# Patient Record
Sex: Male | Born: 1960 | Race: Black or African American | Hispanic: No | Marital: Married | Smoking: Never smoker
Health system: Southern US, Community
[De-identification: ages and names within clinical notes are randomized; demographics above are authoritative.]

---

## 2011-03-31 ENCOUNTER — Emergency Department (HOSPITAL_BASED_OUTPATIENT_CLINIC_OR_DEPARTMENT_OTHER)
Admission: EM | Admit: 2011-03-31 | Discharge: 2011-03-31 | Disposition: A | Discharge status: Home / Self Care | Payer: Self-pay | Attending: Emergency Medicine | Admitting: Emergency Medicine

## 2011-03-31 ENCOUNTER — Encounter: Payer: Self-pay | Admitting: *Deleted

## 2011-03-31 ENCOUNTER — Emergency Department (INDEPENDENT_AMBULATORY_CARE_PROVIDER_SITE_OTHER): Payer: Self-pay

## 2011-03-31 DIAGNOSIS — M25561 Pain in right knee: Secondary | ICD-10-CM

## 2011-03-31 DIAGNOSIS — M25469 Effusion, unspecified knee: Secondary | ICD-10-CM | POA: Insufficient documentation

## 2011-03-31 DIAGNOSIS — M898X9 Other specified disorders of bone, unspecified site: Secondary | ICD-10-CM

## 2011-03-31 DIAGNOSIS — M25569 Pain in unspecified knee: Secondary | ICD-10-CM | POA: Insufficient documentation

## 2011-03-31 MED ORDER — NAPROXEN 500 MG PO TABS: 500.0000 mg | ORAL_TABLET | Freq: Two times a day (BID) | ORAL | Status: AC

## 2011-03-31 MED ORDER — IBUPROFEN 800 MG PO TABS
800.0000 mg | ORAL_TABLET | Freq: Once | ORAL | Status: AC
  Administered 2011-03-31: 800 mg via ORAL
  Filled 2011-03-31: qty 1

## 2011-03-31 NOTE — ED Provider Notes (Signed)
History     CSN: [id number] Arrival date & time: 03/31/2011  8:48 AM  Chief Complaint  Patient presents with  . Knee Pain   Patient is a 50 y.o. male presenting with knee pain. The history is provided by the patient.  Knee Pain This is a new problem. Episode onset: Pain started three days ago, and is presnt only when he tries to stand on teh right leg. Pain is dull, rated at 0/10 currently, but 10/10 when he stands. The problem has not changed since onset.He has tried acetaminophen for the symptoms. The treatment provided mild relief.  He denies trauma or overuse. He has had knee problems in the past, but cannot tell me any specifics.  History reviewed. No pertinent past medical history.  History reviewed. No pertinent past surgical history.  History reviewed. No pertinent family history.  History  Substance Use Topics  . Smoking status: Never Smoker   . Smokeless tobacco: Not on file  . Alcohol Use: No      Review of Systems  All other systems reviewed and are negative.    Physical Exam  BP 123/79  Pulse 75  Temp(Src) 97.9 F (36.6 C) (Oral)  Resp 18  SpO2 100%  Physical Exam  Nursing note and vitals reviewed. Constitutional: He is oriented to person, place, and time. He appears well-developed and well-nourished. No distress.  Eyes: Conjunctivae and EOM are normal. Pupils are equal, round, and reactive to light. No scleral icterus.  Neck: Normal range of motion. Neck supple. No JVD present.  Cardiovascular: Normal rate, regular rhythm and normal heart sounds.   No murmur heard. Pulmonary/Chest: Effort normal and breath sounds normal. He has no wheezes. He has no rales.  Abdominal: Soft. Bowel sounds are normal. He exhibits no mass. There is no tenderness.  Musculoskeletal: Normal range of motion.       There is mild swelling of the right suprapatellar area, with mild tenderness. No instability of the knee, negative Lachman and McMurray tests. No pain with active or  passive ROM , or with active ROM against resistance. Neurovascular is normal.  Lymphadenopathy:    He has no cervical adenopathy.  Neurological: He is alert and oriented to person, place, and time. He has normal reflexes. No cranial nerve deficit. Coordination normal.  Skin: Skin is warm and dry. No rash noted.  Psychiatric: He has a normal mood and affect.    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 Given an initial dose of Ibuprofen. Give crutches. No knee immobilizer given because he is not having pain when bending his knee.  X-rays reviewed, images viewed by me. No results found for this or any previous visit. Dg Knee Complete 4 Views Right  03/31/2011  *RADIOLOGY REPORT*  Clinical Data: Knee pain without injury.  RIGHT KNEE - COMPLETE 4+ VIEW  Comparison: None.  Findings: Patellofemoral spurring noted.  Chronically fragmented osteophyte from the tibial tubercle suggest remote Osgood-Schlatter disease.  Low-level edema noted in Hoffa's fat pad, with an effusion in the suprapatellar bursa.  No acute fracture identified.  IMPRESSION:  1.  Knee effusion. 2.  Chronically fragmented osteophyte from the tibial tubercle suggests remote Osgood-Schlatter disease. 3.  Degenerative patellofemoral spurring.  Original Report Authenticated By: Dellia Cloud, M.D.          Dione Booze, MD 03/31/11 1006

## 2011-03-31 NOTE — ED Notes (Addendum)
the patient states late Friday ne noticed some right knee pain, above his knee cap. the patient denies any twisting, or any aggravation to his right knee. the patient denies any previous knee surgeries. the patient states he he took a tylenol last night with minor relief. the patient walks with an obvious limp. the patient denies no pain until bearing weight

## 2013-03-30 IMAGING — CR DG KNEE COMPLETE 4+V*R*
4 series · 4 of 4 positions shown · non-contrast
Comparison: None.

CLINICAL DATA: Knee pain without injury.

RIGHT KNEE - COMPLETE 4+ VIEW

[t knee ap right]
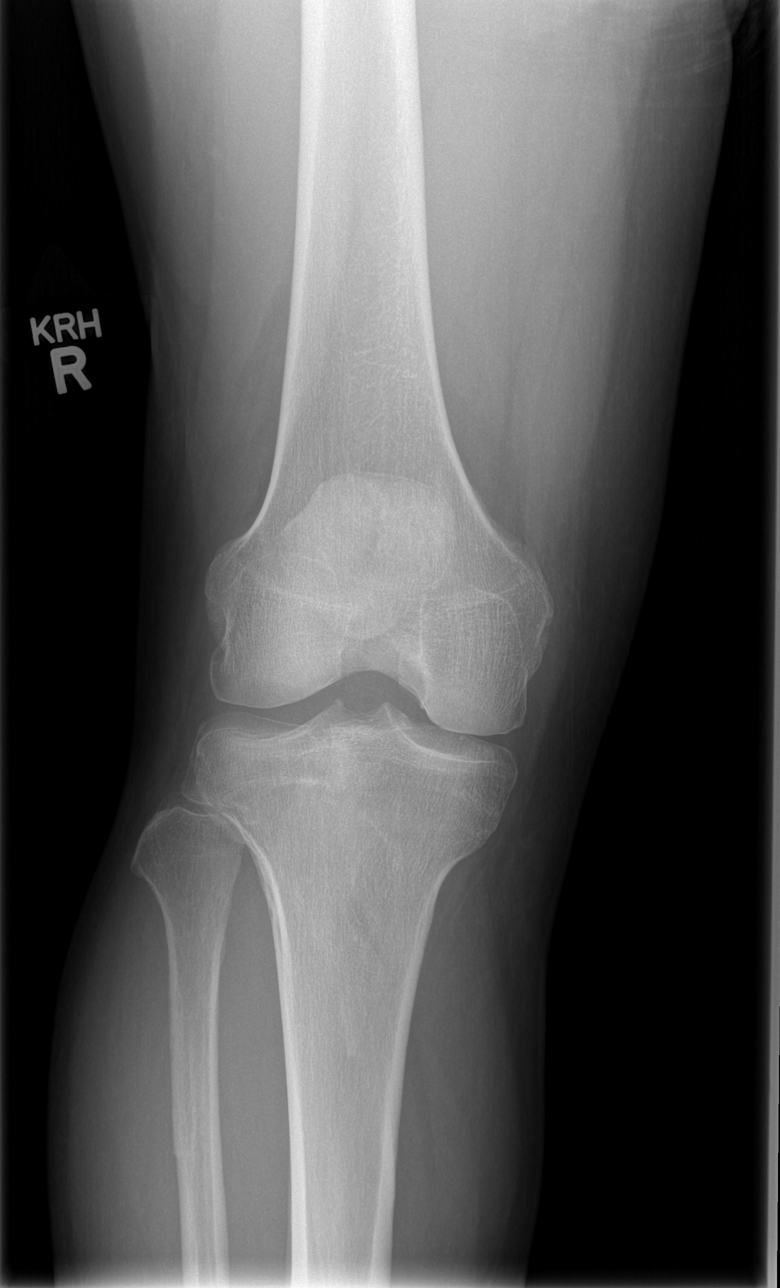

[t knee oblique right (1 of 2)]
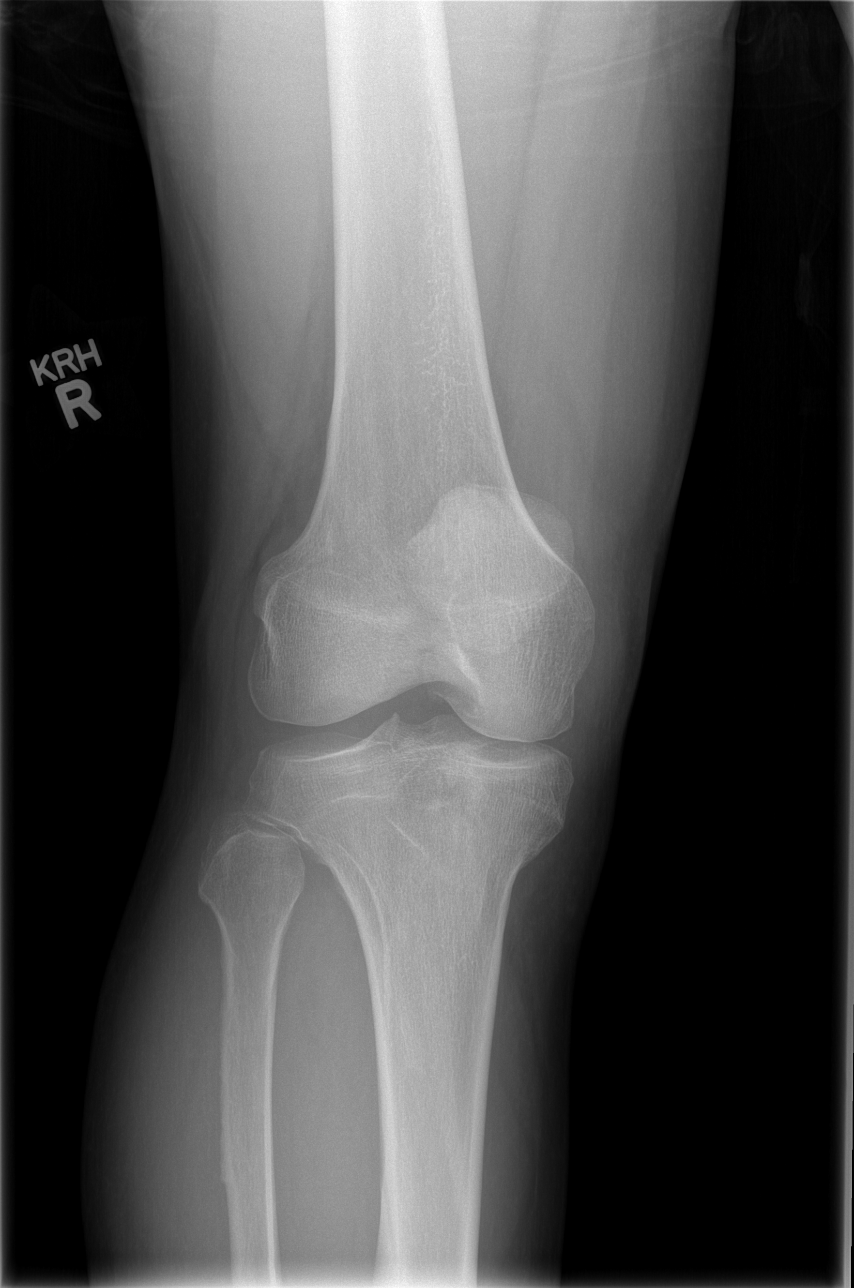

[t knee oblique right (2 of 2)]
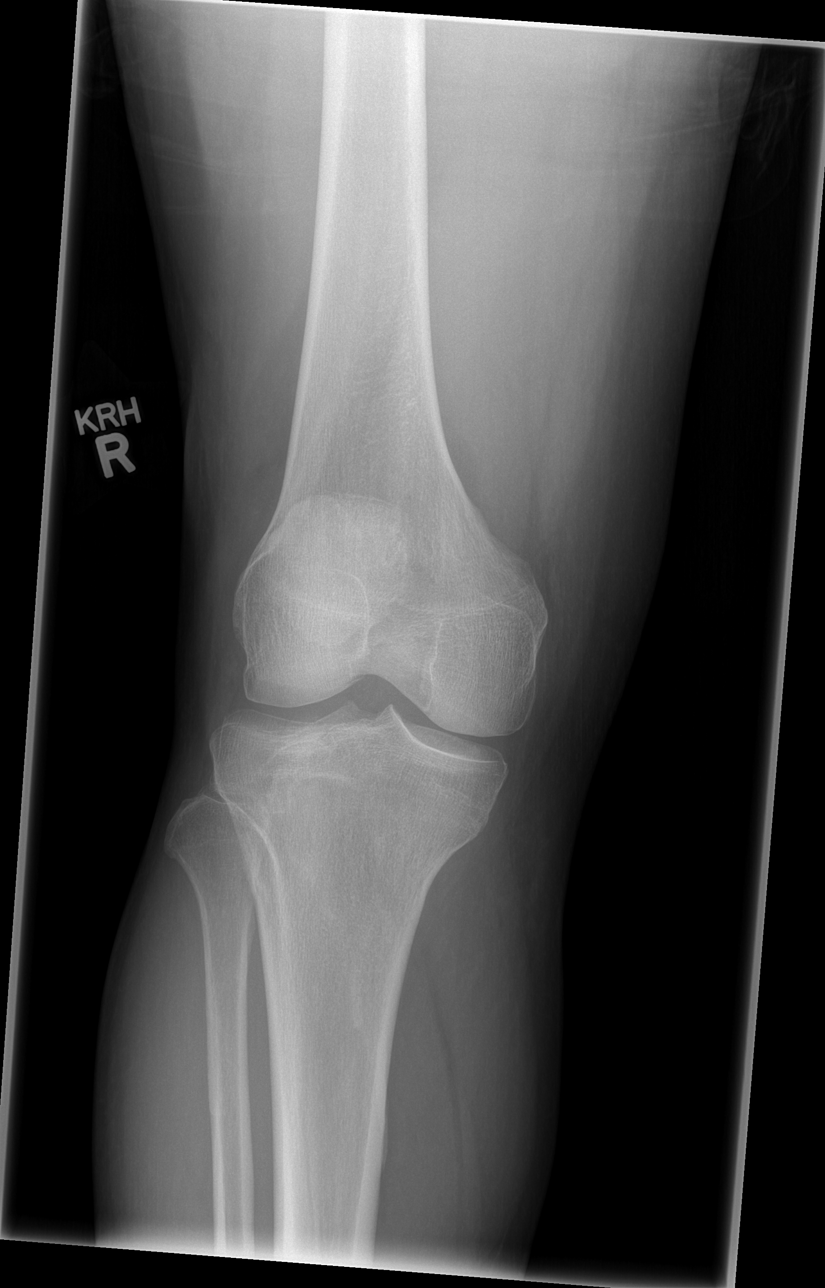

[t knee lat right]
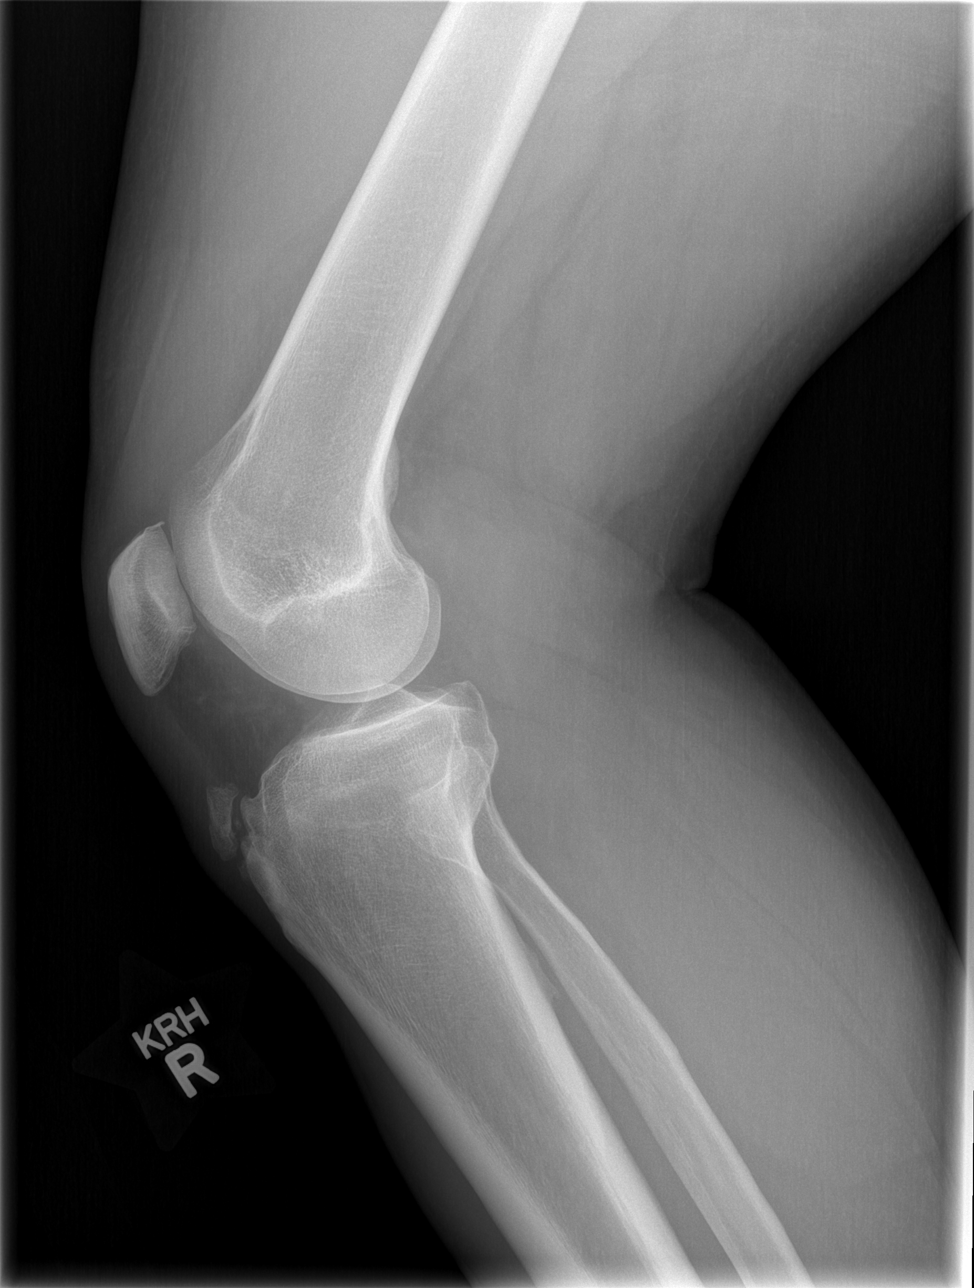

[4 of 4 positions shown; findings below may reference images not displayed]

FINDINGS: Patellofemoral spurring noted.  Chronically fragmented
osteophyte from the tibial tubercle suggest remote Osgood-Schlatter
disease.

Low-level edema noted in Hoffa's fat pad, with an effusion in the
suprapatellar bursa.

No acute fracture identified.
IMPRESSION: 1.  Knee effusion.
2.  Chronically fragmented osteophyte from the tibial tubercle
suggests remote Osgood-Schlatter disease.
3.  Degenerative patellofemoral spurring.

## 2014-03-20 ENCOUNTER — Emergency Department (HOSPITAL_BASED_OUTPATIENT_CLINIC_OR_DEPARTMENT_OTHER): Payer: Self-pay

## 2014-03-20 ENCOUNTER — Emergency Department (HOSPITAL_BASED_OUTPATIENT_CLINIC_OR_DEPARTMENT_OTHER)
Admission: EM | Admit: 2014-03-20 | Discharge: 2014-03-20 | Disposition: A | Discharge status: Home / Self Care | Payer: Self-pay | Attending: Emergency Medicine | Admitting: Emergency Medicine

## 2014-03-20 ENCOUNTER — Encounter (HOSPITAL_BASED_OUTPATIENT_CLINIC_OR_DEPARTMENT_OTHER): Payer: Self-pay | Admitting: Emergency Medicine

## 2014-03-20 DIAGNOSIS — R209 Unspecified disturbances of skin sensation: Secondary | ICD-10-CM | POA: Insufficient documentation

## 2014-03-20 DIAGNOSIS — M62838 Other muscle spasm: Secondary | ICD-10-CM | POA: Insufficient documentation

## 2014-03-20 DIAGNOSIS — M25519 Pain in unspecified shoulder: Secondary | ICD-10-CM | POA: Insufficient documentation

## 2014-03-20 MED ORDER — HYDROCODONE-ACETAMINOPHEN 5-325 MG PO TABS: 1.0000 | ORAL_TABLET | Freq: Four times a day (QID) | ORAL | Status: DC | PRN

## 2014-03-20 MED ORDER — DIAZEPAM 5 MG PO TABS: 5.0000 mg | ORAL_TABLET | Freq: Four times a day (QID) | ORAL | Status: DC | PRN

## 2014-03-20 MED ORDER — IBUPROFEN 800 MG PO TABS: 800.0000 mg | ORAL_TABLET | Freq: Three times a day (TID) | ORAL | Status: DC

## 2014-03-20 NOTE — ED Notes (Signed)
Pt c/o right shoulder pain increased with movt x 1 week , denies injury

## 2014-03-20 NOTE — Discharge Instructions (Signed)
Muscle Cramps and Spasms °Muscle cramps and spasms occur when a muscle or muscles tighten and you have no control over this tightening (involuntary muscle contraction). They are a common problem and can develop in any muscle. The most common place is in the calf muscles of the leg. Both muscle cramps and muscle spasms are involuntary muscle contractions, but they also have differences:  °· Muscle cramps are sporadic and painful. They may last a few seconds to a quarter of an hour. Muscle cramps are often more forceful and last longer than muscle spasms. °· Muscle spasms may or may not be painful. They may also last just a few seconds or much longer. °CAUSES  °It is uncommon for cramps or spasms to be due to a serious underlying problem. In many cases, the cause of cramps or spasms is unknown. Some common causes are:  °· Overexertion.   °· Overuse from repetitive motions (doing the same thing over and over).   °· Remaining in a certain position for a long period of time.   °· Improper preparation, form, or technique while performing a sport or activity.   °· Dehydration.   °· Injury.   °· Side effects of some medicines.   °· Abnormally low levels of the salts and ions in your blood (electrolytes), especially potassium and calcium. This could happen if you are taking water pills (diuretics) or you are pregnant.   °Some underlying medical problems can make it more likely to develop cramps or spasms. These include, but are not limited to:  °· Diabetes.   °· Parkinson disease.   °· Hormone disorders, such as thyroid problems.   °· Alcohol abuse.   °· Diseases specific to muscles, joints, and bones.   °· Blood vessel disease where not enough blood is getting to the muscles.   °HOME CARE INSTRUCTIONS  °· Stay well hydrated. Drink enough water and fluids to keep your urine clear or pale yellow. °· It may be helpful to massage, stretch, and relax the affected muscle. °· For tight or tense muscles, use a warm towel, heating  pad, or hot shower water directed to the affected area. °· If you are sore or have pain after a cramp or spasm, applying ice to the affected area may relieve discomfort. °¨ Put ice in a plastic bag. °¨ Place a towel between your skin and the bag. °¨ Leave the ice on for 15-20 minutes, 03-04 times a day. °· Medicines used to treat a known cause of cramps or spasms may help reduce their frequency or severity. Only take over-the-counter or prescription medicines as directed by your caregiver. °SEEK MEDICAL CARE IF:  °Your cramps or spasms get more severe, more frequent, or do not improve over time.  °MAKE SURE YOU:  °· Understand these instructions. °· Will watch your condition. °· Will get help right away if you are not doing well or get worse. °Document Released: 01/31/2002 Document Revised: 12/06/2012 Document Reviewed: 07/28/2012 °ExitCare® Patient Information ©2015 [employer]. This information is not intended to replace advice given to you by your health care provider. Make sure you discuss any questions you have with your health care provider. ° ° °Emergency Department Resource Guide °1) Find a Doctor and Pay Out of Pocket °Although you won't have to find out who is covered by your insurance plan, it is a good idea to ask around and get recommendations. You will then need to call the office and see if the doctor you have chosen will accept you as a new patient and what types of options they offer   for patients who are self-pay. Some doctors offer discounts or will set up payment plans for their patients who do not have insurance, but you will need to ask so you aren't surprised when you get to your appointment. ° °2) Contact Your Local Health Department °Not all health departments have doctors that can see patients for sick visits, but many do, so it is worth a call to see if yours does. If you don't know where your local health department is, you can check in your phone book. The CDC also has a tool to help  you locate your state's health department, and many state websites also have listings of all of their local health departments. ° °3) Find a Walk-in Clinic °If your illness is not likely to be very severe or complicated, you may want to try a walk in clinic. These are popping up all over the country in pharmacies, drugstores, and shopping centers. They're usually staffed by nurse practitioners or physician assistants that have been trained to treat common illnesses and complaints. They're usually fairly quick and inexpensive. However, if you have serious medical issues or chronic medical problems, these are probably not your best option. ° °No Primary Care Doctor: °- Call [facility] at  832-8000 - they can help you locate a primary care doctor that  accepts your insurance, provides certain services, etc. °- Physician Referral Service- 1-800-533-3463 ° °Chronic Pain Problems: °Organization         Address  Phone   Notes  °[facility]  (336) 297-2271 Patients need to be referred by their primary care doctor.  ° °Medication Assistance: °Organization         Address  Phone   Notes  °[place] [facility] [address] °[place], [place] [address] (336) 641-8030 --Must be a resident of [place] °-- Must have NO insurance coverage whatsoever (no Medicaid/ Medicare, etc.) °-- The pt. MUST have a primary care doctor that directs their care regularly and follows them in the community °  °[facility]  (866) 331-1348   °[employer]  (888) 892-1162   ° °Agencies that provide inexpensive medical care: °Organization         Address  Phone   Notes  °[facility]  (336) 832-8035   °[facility]    (336) 832-7272   °[facility] [address] °[place], [place] [address] (336) 832-4777   °[facility] [address], °[place] (336) 271-4999   °[facility]    (336) 373-0678   °[facility]    (336) 272-1050   °[facility] ° [address], [place] Phone:  (336) 832-4444, Fax:  (336) 832-4440 Hours of Operation:  9 am - 6 pm, M-F.  Also accepts Medicaid/Medicare and self-pay.  °[facility] ° [address], [place] Phone: (336) 832-3150, Fax: (336) [phone]. Hours of Operation:  8:30 am - 5:30 pm, M-F.  Also accepts Medicaid and self-pay.  °[facility] [place] [address], [place] Phone: (336) 878-6027   °[facility] [address], [place], [place] (336)723-1848, Ext. 123 Mondays & Thursdays: 7-9 AM.  First 15 patients are seen on a first come, first serve basis. °  ° °Medicaid-accepting [place] Providers: ° °Organization         Address  Phone   Notes  °[facility]   [address], [place] (336) 641-2100 Also accepts self-pay patients.  °[facility] [address], [place] ° (336) 856-9996   °[facility] [address], [place] (336) 288-8857   °[facility] [address] [place] [address], [place] (336) 299-7000   °Veita Bland [address], [place]  ° (336) 373-1557 Only accepts [place] Access Medicaid patients after they have their name applied to their card.  ° °Self-Pay (no insurance) in [place]: ° °Organization         Address  Phone   Notes  °Sickle Cell Patients, [facility] [address], [place] (336) 832-1970   °[facility] [address], [place] (336) 832-4400   °[facility] [place] ° [address],  (336) 992-4800   °[facility]/Dr. Osei-Bonsu ° [address] [place] [address], [place] or [address], [place] (336) 841-8500 Phone number for both [place] and [place] locations is the same.  °[facility] [address],  [place] (336) 299-0000   °[facility] [address] [place] [address], [place] or [address] (336) 852-7530 °(336) 878-2260   °[facility] [address], [place] (336) 350-1642, phone; (336) 294-5005, fax Sees patients 1st and 3rd Saturday of every month.  Must not qualify for public or private insurance (i.e. Medicaid, Medicare, [place] Health Choice, Veterans' Benefits) • Household income should be no more than 200% of the poverty level •The clinic cannot treat you if you are pregnant or think you are pregnant • Sexually transmitted diseases are not treated at the clinic.  ° ° °Dental Care: °Organization         Address  Phone  Notes  °[facility] [address], [place] (336) 641-6152 Accepts children up to age 21 who are enrolled in Medicaid or [place] Health Choice; pregnant women with a Medicaid card; and children who have applied for Medicaid or [place] Health Choice, but were declined, whose parents can pay a reduced fee at time of service.  °[facility] [place]  [address], [place] (336) 641-7733 Accepts children up to age 21 who are enrolled in Medicaid or [place] Health Choice; pregnant women with a Medicaid card; and children who have applied for Medicaid or  Health Choice, but were declined, whose parents can pay a reduced fee at time of service.  °[facility] ° [address], [place] (336) 641-4533 Patients are seen by appointment only. Walk-ins are not accepted. [facility] will see patients 18 years of age and older. °Monday - Tuesday (8am-5pm) °Most Wednesdays (8:30-5pm) °$30 per visit, cash only  °[facility] ° [address], [place] (336) 641-4533 Patients are seen by appointment only. Walk-ins are not accepted. [facility] will see patients 18 years of age and older. °One Wednesday Evening  (Monthly: Volunteer Based).  $30 per visit, cash only  °[facility]  (919) 537-3737 for adults; Children under age 4, call Graduate Pediatric Dentistry at ([phone]) [phone]. Children aged 4-14, please call ([phone]) [phone] to request a pediatric application. ° Dental services are provided in all areas of dental care including fillings, crowns and bridges, complete and partial dentures, implants, gum   treatment, root canals, and extractions. Preventive care is also provided. Treatment is provided to both adults and children. °Patients are selected via a lottery and there is often a waiting list. °  °[facility] [address], °[place] ° (336) 763-8833 [email or web address] °  °[facility] [address], [place], [place] (336)723-1848, Ext. 123 Second and Fourth Thursday of each month, opens at 6:30 AM; Clinic ends at 9 AM.  Patients are seen on a first-come first-served basis, and a limited number are seen during each clinic.  ° °[facility] ° [address], [place], [place] (336) 723-7904   Eligibility Requirements °You must have lived in [place], [place], or [place] counties for at least the last three months. °  You cannot be eligible for state or federal sponsored healthcare insurance, including Veterans Administration, Medicaid, or Medicare. °  You generally cannot be eligible for healthcare insurance through your employer.  °  How to apply: °Eligibility screenings are held every Tuesday and Wednesday afternoon from 1:00 pm until 4:00 pm. You do not need an appointment for the interview!  °[facility] [address], [place], [place] 336-631-2330   °[facility]  336-342-8273   °[facility]  336-703-3100   °[facility]  336-570-6415   ° °Behavioral Health Resources in the Community: °Intensive Outpatient Programs °Organization         Address  Phone  Notes  °[place]  [facility] [address], [place], [place] 336-878-6098   °[facility] [address], [place], [place] 336-832-9800   °[facility] [address], [place], [place] ° 336-882-2125   °[facility] [address],  °[place], [place] 1-800-853-5163 or 336-641-4981   °Substance Abuse Resources °Organization         Address  Phone  Notes  °[facility]  336-882-2125   °[facility]  336-784-9470   °[facility]  336-285-9073   °[facility]  336-845-3988   °Residential & Outpatient Substance Abuse Program  1-800-659-3381   °Psychological Services °Organization         Address  Phone  Notes  °[facility]  336- 832-9600   °[facility]  336- 378-7881   °[facility] [address], [place] 1-800-853-5163 or 336-641-4981   ° °Mobile Crisis Teams °Organization         Address  Phone  Notes  °[facility], Mobile Crisis Care Unit  1-877-626-1772   °Assertive °[facility] ° [address] [place], [place] 336-834-9664   °Sharon DeEsch [address] °[place] 336-554-5454   ° °Self-Help/Support Groups °Organization         Address  Phone             Notes  °[facility] - variety of support groups  336- 373-1402 Call for more information  °Narcotics Anonymous (NA), [facility] [address], °[place]  2 meetings at this location  ° °Residential Treatment Programs °Organization         Address  Phone  Notes  °[facility] [address],    °[place]  1-866-801-8205   °[facility] ° [address], [place], [place] 704-293-8524   °[facility] [address], [place] 336-845-3988 Admissions: 8am-3pm M-F  °[facility] [address]    °[place], [place] 336-841-1104   °[facility] [address],  [place], [place] 336-379-7146   °[facility] [address]  °[place], [place] 336-285-9073   °[facility] - Intensive Outpatient [address], [place], [place] 336-852-3033   °[facility] [address]  °[place], [place] 1-877-615-2722 or 336-784-9470   °[facility] [address] [place], [place] 336-227-7417 Accepts Medicaid  °[facility] [address]  °[place] 1-800-659-3381 Substance Abuse/Addiction Treatment  ° °[place] Behavioral Health Resources °Organization         Address  Phone  Notes  °[facility]  (888) 581-9988   °Julie Brannon, PhD [address] [place], [place]   (336) 349-5553 or (336) 951-0000   ° Behavioral   [address] °[place], [place] (336) 349-4454   °[facility] [address], [place], [place] (336) 342-8316 Insurance/Medicaid/sponsorship through [facility]  °[facility] [address]                                    [place], [place] (336) 342-8316 Therapy/tele-psych/case  °[facility] [address]  ° [place], [place] (336) 349-2233    °Dr. Arfeen  (336) 349-4544   °[facility] [place]  [employer] [place] [facility] 1) [address], [place] °2) [address], [place] °3)  [address], [place] (336) 349-3220 °(336) 342-7768 ° °(336) 342-8140   °[place] [facility] Abuse [facility] (336) 342-1394 or (336) 342-3537 (After Hours)    ° ° ° °

## 2014-03-20 NOTE — ED Provider Notes (Signed)
CSN: [id number]     Arrival date & time 03/20/14  1519 History  This chart was scribed for Dagmar HaitWilliam Keyden Pavlov, MD by Milly JakobJohn Lee Graves, ED Scribe. The patient was seen in room MH11/MH11. Patient's care was started at 4:39 PM.    Chief Complaint  Patient presents with  . Shoulder Pain   Patient is a 53 y.o. male presenting with shoulder pain. The history is provided by the patient. No language interpreter was used.  Shoulder Pain This is a new problem. The current episode started more than 2 days ago. The problem occurs constantly. The problem has not changed since onset.Pertinent negatives include no chest pain, no abdominal pain, no headaches and no shortness of breath. Exacerbated by: relaxing it. Relieved by: movement. He has tried acetaminophen for the symptoms. The treatment provided mild relief.  HPI Comments: Nathan NeighborsDexter Mays is a 53 y.o. male who presents to the Emergency Department complaining of constant, shooting pain and numbness up and down his right arm for the past week. He reports that the pain is worst when he relaxes his arm, and states that the pain is relieved by movement. He reports taking Ibuprofen with transient relief. He denies injury or overuse of his arm.   History reviewed. No pertinent past medical history. History reviewed. No pertinent past surgical history. History reviewed. No pertinent family history. History  Substance Use Topics  . Smoking status: Never Smoker   . Smokeless tobacco: Not on file  . Alcohol Use: No    Review of Systems  Constitutional: Negative for fever and chills.  Respiratory: Negative for shortness of breath.   Cardiovascular: Negative for chest pain.  Gastrointestinal: Negative for abdominal pain.  Musculoskeletal: Positive for arthralgias (right shoulder).  Neurological: Positive for numbness (right arm). Negative for headaches.  All other systems reviewed and are negative.     Allergies  Review of patient's allergies indicates no  known allergies.  Home Medications   Prior to Admission medications   Not on File   Triage Vitals: BP 110/76  Pulse 78  Temp(Src) 98 F (36.7 C) (Oral)  Resp 16  Ht 5\' 4"  (1.626 m)  Wt 190 lb (86.183 kg)  BMI 32.60 kg/m2  SpO2 100% Physical Exam  Nursing note and vitals reviewed. Constitutional: He is oriented to person, place, and time. He appears well-developed and well-nourished. No distress.  HENT:  Head: Normocephalic and atraumatic.  Eyes: Conjunctivae and EOM are normal.  Neck: Neck supple. No tracheal deviation present.  Cardiovascular: Normal rate.   Pulmonary/Chest: Effort normal. No respiratory distress.  Abdominal: Soft. He exhibits no distension. There is no tenderness. There is no rebound.  Musculoskeletal: Normal range of motion.       Right shoulder: He exhibits swelling (lateral deltoid). He exhibits normal range of motion, no tenderness, no bony tenderness, no effusion, no deformity and no laceration.  Neurological: He is alert and oriented to person, place, and time.  Skin: Skin is warm and dry.  Psychiatric: He has a normal mood and affect. His behavior is normal.    ED Course  Procedures (including critical care time) DIAGNOSTIC STUDIES: Oxygen Saturation is 100% on room air, normal by my interpretation.    COORDINATION OF CARE: 4:46 PM-Discussed treatment plan with pt at bedside and pt agreed to plan.   Labs Review Labs Reviewed - No data to display  Imaging Review Dg Shoulder Right  03/20/2014   CLINICAL DATA:  RIGHT shoulder pain for a week with normal  injury  EXAM: RIGHT SHOULDER - 2+ VIEW  COMPARISON:  None.  FINDINGS: AC joint alignment normal.  Osseous mineralization normal.  No acute fracture, dislocation or bone destruction.  Visualized RIGHT ribs unremarkable.  IMPRESSION: No acute osseous abnormalities.   Electronically Signed   By: Ulyses Southward M.D.   On: 03/20/2014 15:42     EKG Interpretation None      MDM   Final diagnoses:   Muscle spasm of right shoulder    53 year old male presents with one week of right shoulder pain. Worse with rest, better with moving around. No injury. No fevers, nausea, vomiting. On exam he has some swelling of the right shoulder in the muscles. He has normal range of motion. There is a joint effusion present. Exam is consistent with muscle spasm right shoulder. He is no pain with rotator cuff testing. Concern for possible muscle spasms. Given pain meds, muscle relaxers. Instructed to continue taking high dose NSAIDs and followup with PCP in physical therapy. X-ray is normal. Exam is not consistent with septic arthritis. He stable for discharge.  I personally performed the services described in this documentation, which was scribed in my presence. The recorded information has been reviewed and is accurate.     Dagmar Hait, MD 03/20/14 1700

## 2023-10-01 ENCOUNTER — Emergency Department (HOSPITAL_BASED_OUTPATIENT_CLINIC_OR_DEPARTMENT_OTHER): Payer: No Typology Code available for payment source

## 2023-10-01 ENCOUNTER — Other Ambulatory Visit: Payer: Self-pay

## 2023-10-01 ENCOUNTER — Encounter (HOSPITAL_BASED_OUTPATIENT_CLINIC_OR_DEPARTMENT_OTHER): Payer: Self-pay | Admitting: Emergency Medicine

## 2023-10-01 ENCOUNTER — Inpatient Hospital Stay (HOSPITAL_BASED_OUTPATIENT_CLINIC_OR_DEPARTMENT_OTHER)
Admission: EM | Admit: 2023-10-01 | Discharge: 2023-10-04 | DRG: 193 | Disposition: A | Discharge status: Home / Self Care | Payer: No Typology Code available for payment source | Attending: Internal Medicine | Admitting: Internal Medicine

## 2023-10-01 DIAGNOSIS — E119 Type 2 diabetes mellitus without complications: Principal | ICD-10-CM

## 2023-10-01 DIAGNOSIS — N179 Acute kidney failure, unspecified: Secondary | ICD-10-CM | POA: Diagnosis present

## 2023-10-01 DIAGNOSIS — E860 Dehydration: Secondary | ICD-10-CM | POA: Diagnosis present

## 2023-10-01 DIAGNOSIS — R509 Fever, unspecified: Secondary | ICD-10-CM

## 2023-10-01 DIAGNOSIS — E871 Hypo-osmolality and hyponatremia: Secondary | ICD-10-CM | POA: Diagnosis present

## 2023-10-01 DIAGNOSIS — I951 Orthostatic hypotension: Secondary | ICD-10-CM | POA: Diagnosis present

## 2023-10-01 DIAGNOSIS — E1165 Type 2 diabetes mellitus with hyperglycemia: Secondary | ICD-10-CM | POA: Diagnosis present

## 2023-10-01 DIAGNOSIS — E66811 Obesity, class 1: Secondary | ICD-10-CM | POA: Diagnosis present

## 2023-10-01 DIAGNOSIS — E66812 Obesity, class 2: Secondary | ICD-10-CM

## 2023-10-01 DIAGNOSIS — J9811 Atelectasis: Secondary | ICD-10-CM | POA: Diagnosis present

## 2023-10-01 DIAGNOSIS — R0602 Shortness of breath: Secondary | ICD-10-CM | POA: Diagnosis present

## 2023-10-01 DIAGNOSIS — J101 Influenza due to other identified influenza virus with other respiratory manifestations: Principal | ICD-10-CM | POA: Diagnosis present

## 2023-10-01 DIAGNOSIS — R5381 Other malaise: Secondary | ICD-10-CM | POA: Diagnosis present

## 2023-10-01 DIAGNOSIS — R197 Diarrhea, unspecified: Secondary | ICD-10-CM

## 2023-10-01 DIAGNOSIS — J111 Influenza due to unidentified influenza virus with other respiratory manifestations: Secondary | ICD-10-CM

## 2023-10-01 DIAGNOSIS — Z1152 Encounter for screening for COVID-19: Secondary | ICD-10-CM

## 2023-10-01 DIAGNOSIS — R059 Cough, unspecified: Secondary | ICD-10-CM

## 2023-10-01 DIAGNOSIS — R739 Hyperglycemia, unspecified: Secondary | ICD-10-CM | POA: Diagnosis present

## 2023-10-01 DIAGNOSIS — Z6833 Body mass index (BMI) 33.0-33.9, adult: Secondary | ICD-10-CM

## 2023-10-01 DIAGNOSIS — A419 Sepsis, unspecified organism: Secondary | ICD-10-CM

## 2023-10-01 DIAGNOSIS — R55 Syncope and collapse: Principal | ICD-10-CM

## 2023-10-01 DIAGNOSIS — J9601 Acute respiratory failure with hypoxia: Secondary | ICD-10-CM | POA: Diagnosis present

## 2023-10-01 LAB — COMPREHENSIVE METABOLIC PANEL
ALT: 23 U/L (ref 0–44)
AST: 35 U/L (ref 15–41)
Albumin: 3.8 g/dL (ref 3.5–5.0)
Alkaline Phosphatase: 76 U/L (ref 38–126)
Anion gap: 13 (ref 5–15)
BUN: 18 mg/dL (ref 8–23)
CO2: 21 mmol/L — ABNORMAL LOW (ref 22–32)
Calcium: 9 mg/dL (ref 8.9–10.3)
Chloride: 98 mmol/L (ref 98–111)
Creatinine, Ser: 1.6 mg/dL — ABNORMAL HIGH (ref 0.61–1.24)
GFR, Estimated: 48 mL/min — ABNORMAL LOW (ref >60)
Glucose, Bld: 183 mg/dL — ABNORMAL HIGH (ref 70–99)
Potassium: 4 mmol/L (ref 3.5–5.1)
Sodium: 132 mmol/L — ABNORMAL LOW (ref 135–145)
Total Bilirubin: 0.6 mg/dL (ref 0.0–1.2)
Total Protein: 7.7 g/dL (ref 6.5–8.1)

## 2023-10-01 LAB — PROTIME-INR
INR: 1.1 (ref 0.8–1.2)
Prothrombin Time: 14.9 s (ref 11.4–15.2)

## 2023-10-01 LAB — CBC WITH DIFFERENTIAL/PLATELET
Abs Immature Granulocytes: 0.05 10*3/uL (ref 0.00–0.07)
Basophils Absolute: 0 10*3/uL (ref 0.0–0.1)
Basophils Relative: 0 %
Eosinophils Absolute: 0 10*3/uL (ref 0.0–0.5)
Eosinophils Relative: 0 %
HCT: 39.7 % (ref 39.0–52.0)
Hemoglobin: 13.4 g/dL (ref 13.0–17.0)
Immature Granulocytes: 1 %
Lymphocytes Relative: 11 %
Lymphs Abs: 1.1 10*3/uL (ref 0.7–4.0)
MCH: 29.7 pg (ref 26.0–34.0)
MCHC: 33.8 g/dL (ref 30.0–36.0)
MCV: 88 fL (ref 80.0–100.0)
Monocytes Absolute: 0.7 10*3/uL (ref 0.1–1.0)
Monocytes Relative: 7 %
Neutro Abs: 8.7 10*3/uL — ABNORMAL HIGH (ref 1.7–7.7)
Neutrophils Relative %: 81 %
Platelets: 200 10*3/uL (ref 150–400)
RBC: 4.51 MIL/uL (ref 4.22–5.81)
RDW: 13.6 % (ref 11.5–15.5)
WBC: 10.6 10*3/uL — ABNORMAL HIGH (ref 4.0–10.5)
nRBC: 0 % (ref 0.0–0.2)

## 2023-10-01 LAB — RESP PANEL BY RT-PCR (RSV, FLU A&B, COVID)  RVPGX2
Influenza A by PCR: POSITIVE — AB
Influenza B by PCR: NEGATIVE
Resp Syncytial Virus by PCR: NEGATIVE
SARS Coronavirus 2 by RT PCR: NEGATIVE

## 2023-10-01 LAB — LACTIC ACID, PLASMA: Lactic Acid, Venous: 1.7 mmol/L (ref 0.5–1.9)

## 2023-10-01 LAB — APTT: aPTT: 31 s (ref 24–36)

## 2023-10-01 MED ORDER — ONDANSETRON HCL 4 MG PO TABS: 4.0000 mg | ORAL_TABLET | Freq: Four times a day (QID) | ORAL | Status: DC | PRN

## 2023-10-01 MED ORDER — SODIUM CHLORIDE 0.9 % IV BOLUS (SEPSIS)
1000.0000 mL | Freq: Once | INTRAVENOUS | Status: AC
  Administered 2023-10-01: 1000 mL via INTRAVENOUS

## 2023-10-01 MED ORDER — HEPARIN SODIUM (PORCINE) 5000 UNIT/ML IJ SOLN
5000.0000 [IU] | Freq: Three times a day (TID) | INTRAMUSCULAR | Status: DC
  Administered 2023-10-01 – 2023-10-04 (×8): 5000 [IU] via SUBCUTANEOUS
  Filled 2023-10-01 (×8): qty 1

## 2023-10-01 MED ORDER — SODIUM CHLORIDE 0.9 % IV SOLN
500.0000 mg | Freq: Once | INTRAVENOUS | Status: AC
  Administered 2023-10-01: 500 mg via INTRAVENOUS
  Filled 2023-10-01: qty 5

## 2023-10-01 MED ORDER — SODIUM CHLORIDE 0.9 % IV SOLN
2.0000 g | Freq: Once | INTRAVENOUS | Status: AC
  Administered 2023-10-01: 2 g via INTRAVENOUS
  Filled 2023-10-01: qty 20

## 2023-10-01 MED ORDER — LACTATED RINGERS IV SOLN: INTRAVENOUS | Status: DC

## 2023-10-01 MED ORDER — PANTOPRAZOLE SODIUM 40 MG IV SOLR
40.0000 mg | INTRAVENOUS | Status: DC
  Administered 2023-10-01: 40 mg via INTRAVENOUS
  Filled 2023-10-01: qty 10

## 2023-10-01 MED ORDER — SODIUM CHLORIDE 0.9 % IV BOLUS
1000.0000 mL | Freq: Once | INTRAVENOUS | Status: AC
  Administered 2023-10-01: 1000 mL via INTRAVENOUS

## 2023-10-01 MED ORDER — ACETAMINOPHEN 650 MG RE SUPP
650.0000 mg | Freq: Once | RECTAL | Status: AC
  Administered 2023-10-01: 650 mg via RECTAL
  Filled 2023-10-01: qty 1

## 2023-10-01 MED ORDER — OSELTAMIVIR PHOSPHATE 75 MG PO CAPS
75.0000 mg | ORAL_CAPSULE | Freq: Once | ORAL | Status: AC
  Administered 2023-10-01: 75 mg via ORAL
  Filled 2023-10-01: qty 1

## 2023-10-01 MED ORDER — SODIUM CHLORIDE 0.9 % IV SOLN: 2.0000 g | INTRAVENOUS | Status: DC

## 2023-10-01 MED ORDER — MELATONIN 5 MG PO TABS: 10.0000 mg | ORAL_TABLET | Freq: Every evening | ORAL | Status: DC | PRN

## 2023-10-01 MED ORDER — ONDANSETRON HCL 4 MG/2ML IJ SOLN: 4.0000 mg | Freq: Four times a day (QID) | INTRAMUSCULAR | Status: DC | PRN

## 2023-10-01 MED ORDER — ALBUTEROL SULFATE (2.5 MG/3ML) 0.083% IN NEBU
2.5000 mg | INHALATION_SOLUTION | Freq: Four times a day (QID) | RESPIRATORY_TRACT | Status: DC
  Administered 2023-10-02: 2.5 mg via RESPIRATORY_TRACT
  Filled 2023-10-01: qty 3

## 2023-10-01 MED ORDER — IPRATROPIUM-ALBUTEROL 0.5-2.5 (3) MG/3ML IN SOLN
3.0000 mL | Freq: Once | RESPIRATORY_TRACT | Status: AC
  Administered 2023-10-01: 3 mL via RESPIRATORY_TRACT
  Filled 2023-10-01: qty 3

## 2023-10-01 MED ORDER — KETOROLAC TROMETHAMINE 15 MG/ML IJ SOLN
15.0000 mg | Freq: Once | INTRAMUSCULAR | Status: AC
  Administered 2023-10-01: 15 mg via INTRAVENOUS
  Filled 2023-10-01: qty 1

## 2023-10-01 MED ORDER — GUAIFENESIN-DM 100-10 MG/5ML PO SYRP
15.0000 mL | ORAL_SOLUTION | ORAL | Status: DC | PRN
  Administered 2023-10-03: 15 mL via ORAL
  Filled 2023-10-01: qty 15

## 2023-10-01 MED ORDER — ACETAMINOPHEN 500 MG PO TABS
1000.0000 mg | ORAL_TABLET | Freq: Four times a day (QID) | ORAL | Status: DC | PRN
  Administered 2023-10-02 – 2023-10-03 (×3): 1000 mg via ORAL
  Filled 2023-10-01 (×3): qty 2

## 2023-10-01 MED ORDER — OSELTAMIVIR PHOSPHATE 30 MG PO CAPS
30.0000 mg | ORAL_CAPSULE | Freq: Two times a day (BID) | ORAL | Status: DC
  Administered 2023-10-01 – 2023-10-04 (×6): 30 mg via ORAL
  Filled 2023-10-01 (×6): qty 1

## 2023-10-01 MED ORDER — METRONIDAZOLE 500 MG/100ML IV SOLN
500.0000 mg | Freq: Once | INTRAVENOUS | Status: AC
  Administered 2023-10-01: 500 mg via INTRAVENOUS
  Filled 2023-10-01: qty 100

## 2023-10-01 MED ORDER — ACETAMINOPHEN 650 MG RE SUPP: 650.0000 mg | Freq: Four times a day (QID) | RECTAL | Status: DC | PRN

## 2023-10-01 MED ORDER — SODIUM CHLORIDE 0.9 % IV SOLN: 500.0000 mg | INTRAVENOUS | Status: DC

## 2023-10-01 NOTE — Assessment & Plan Note (Signed)
 Due to influenza A.  Pt not on home O2. Incentive spirometry. Prn nebs. Wean O2 to keep sats >92%

## 2023-10-01 NOTE — Sepsis Progress Note (Signed)
 Elink monitoring for the code sepsis protocol.

## 2023-10-01 NOTE — Assessment & Plan Note (Signed)
BMI 37.42 

## 2023-10-01 NOTE — Assessment & Plan Note (Signed)
Continue with IVF. Repeat BMP in AM. 

## 2023-10-01 NOTE — ED Notes (Signed)
 Pt. Blood sugar is 183 taken per EDP order

## 2023-10-01 NOTE — Assessment & Plan Note (Signed)
 Continue tamiflu  and supportive care. Stop abx.

## 2023-10-01 NOTE — ED Notes (Addendum)
   10/01/23 1310  Respiratory Assessment  $ RT Protocol Assessment  Yes  Assessment Type Assess only  Respiratory Pattern Regular;Symmetrical;Shallow  Chest Assessment Chest expansion symmetrical  Bilateral Breath Sounds Clear;Diminished  Oxygen Therapy/Pulse Ox  O2 Device Nasal Cannula  O2 Therapy Oxygen humidified   SpO2 88% on room air, placed on 4lpm [place]. SpO2 96%.  EKG done and copy to Dr Elnor

## 2023-10-01 NOTE — Assessment & Plan Note (Signed)
 Check A1c.

## 2023-10-01 NOTE — ED Provider Notes (Signed)
 Blood pressure 100/60, pulse 93, temperature 98.7 F (37.1 C), temperature source Oral, resp. rate 19, weight 89.7 kg, SpO2 98%.  Assuming care from Dr. Elnor.  In short, Nathan Mays is a 63 y.o. male with a chief complaint of Fever .  Refer to the original H&P for additional details.  The current plan of care is to reassess after IVF.  04:36 PM  Patient reevaluated after IV fluid bolus.  Blood pressure trending upward with systolics consistently in the 105 range with MAP of 78.  Patient looking well.  Remains on 4 L.  Have paged to the hospitalist service back to place bed request.  Discussed patient's case with [facility], Dr. Tobie to request admission. Patient and family (if present) updated with plan.  I reviewed all nursing notes, vitals, pertinent old records, EKGs, labs, imaging (as available).     Darra Fonda MATSU, MD 10/01/23 [phone]

## 2023-10-01 NOTE — ED Triage Notes (Signed)
 Fever and poor appetite x 2 days , coughing . Lethargic . Pt had syncope during triage . Regained consciousness within 5 seconds .   Alert and oriented x 4 after that

## 2023-10-01 NOTE — Progress Notes (Signed)
 Patient: Nathan Mays FMW:[id number] DOB: 21-Jun-1961 DOA: 10/01/2023 DOS: the patient was seen and examined on 10/01/2023 PCP: Boyd Jayson HERO, PA-C  Patient coming from:  [facility] Chief complaint: Chief Complaint  Patient presents with   Fever   HPI:  Nathan Mays is a 63 y.o. male with no significant past medical history presents to med [facility] with complaints of shortness of breath generalized weakness cough congestion upper respiratory symptoms along with fever.  Admission requested for respiratory illness and sepsis.  Vitals:   10/01/23 1500 10/01/23 1515 10/01/23 1530 10/01/23 1545  BP: (!) 91/59 98/63 100/60   Pulse: 100 97 93   Temp:    98.7 F (37.1 C)  Resp: 20 19 19    Weight:      SpO2: 97% 95% 98%   TempSrc:    Oral   ED evaluation  so far shows: Patient noted to be hypotensive tachycardic febrile on initial presentation also hypoxic on 4 L nasal cannula with O2 sats improving to 93%. Metabolic panel showing sodium 867 glucose 183 AKI creatinine of 1.60 EGFR of 40 normal LFTs lactic acid of 1.7. CBC shows leukocytosis of 10.6 normal hemoglobin and platelet count normal differentials. Respiratory panel positive for influenza A. Chest x-ray shows bibasilar atelectasis and no evidence of pneumonia and low lung volumes.   In the emergency room  pt has received the following treatment thus far: Medications  lactated ringers  infusion ( Intravenous New Bag/Given 10/01/23 1257)  pantoprazole  (PROTONIX ) injection 40 mg (has no administration in time range)  oseltamivir  (TAMIFLU ) capsule 75 mg (has no administration in time range)  cefTRIAXone  (ROCEPHIN ) 2 g in sodium chloride  0.9 % 100 mL IVPB (has no administration in time range)  azithromycin  (ZITHROMAX ) 500 mg in sodium chloride  0.9 % 250 mL IVPB (has no administration in time range)  sodium chloride  0.9 % bolus 1,000 mL (0 mLs Intravenous Stopped 10/01/23 1403)  cefTRIAXone  (ROCEPHIN ) 2 g in sodium chloride  0.9 % 100  mL IVPB (0 g Intravenous Stopped 10/01/23 1403)  azithromycin  (ZITHROMAX ) 500 mg in sodium chloride  0.9 % 250 mL IVPB (0 mg Intravenous Stopped 10/01/23 1403)  metroNIDAZOLE  (FLAGYL ) IVPB 500 mg (0 mg Intravenous Stopped 10/01/23 1403)  ketorolac  (TORADOL ) 15 MG/ML injection 15 mg (15 mg Intravenous Given 10/01/23 1300)  acetaminophen  (TYLENOL ) suppository 650 mg (650 mg Rectal Given 10/01/23 1300)  oseltamivir  (TAMIFLU ) capsule 75 mg (75 mg Oral Given 10/01/23 1410)  ipratropium-albuterol  (DUONEB) 0.5-2.5 (3) MG/3ML nebulizer solution 3 mL (3 mLs Nebulization Given 10/01/23 1420)  sodium chloride  0.9 % bolus 1,000 mL (1,000 mLs Intravenous New Bag/Given 10/01/23 1540)    History reviewed. No pertinent past medical history. History reviewed. No pertinent surgical history.  reports that he has never smoked. He does not have any smokeless tobacco history on file. He reports that he does not drink alcohol and does not use drugs.  No Known Allergies  History reviewed. No pertinent family history.  Prior to Admission medications   Medication Sig Start Date End Date Taking? Authorizing Provider  diazepam  (VALIUM ) 5 MG tablet Take 1 tablet (5 mg total) by mouth every 6 (six) hours as needed for muscle spasms (spasms). 03/20/14   Elpidio Lamp, MD  HYDROcodone -acetaminophen  (NORCO/VICODIN) 5-325 MG per tablet Take 1 tablet by mouth every 6 (six) hours as needed for moderate pain. 03/20/14   Elpidio Lamp, MD  ibuprofen  (ADVIL ,MOTRIN ) 800 MG tablet Take 1 tablet (800 mg total) by mouth 3 (three) times daily.  03/20/14   Elpidio Lamp, MD     Vitals:   10/01/23 1500 10/01/23 1515 10/01/23 1530 10/01/23 1545  BP: (!) 91/59 98/63 100/60   Pulse: 100 97 93   Resp: 20 19 19    Temp:    98.7 F (37.1 C)  TempSrc:    Oral  SpO2: 97% 95% 98%   Weight:         Labs on Admission: I have personally reviewed following labs and imaging studies Results for orders placed or performed during the hospital encounter of  10/01/23 (from the past 24 hours)  Resp panel by RT-PCR (RSV, Flu A&B, Covid) Anterior Nasal Swab     Status: Abnormal   Collection Time: 10/01/23 12:44 PM   Specimen: Anterior Nasal Swab  Result Value Ref Range   SARS Coronavirus 2 by RT PCR NEGATIVE NEGATIVE   Influenza A by PCR POSITIVE (A) NEGATIVE   Influenza B by PCR NEGATIVE NEGATIVE   Resp Syncytial Virus by PCR NEGATIVE NEGATIVE  Lactic acid, plasma     Status: None   Collection Time: 10/01/23 12:44 PM  Result Value Ref Range   Lactic Acid, Venous 1.7 0.5 - 1.9 mmol/L  Comprehensive metabolic panel     Status: Abnormal   Collection Time: 10/01/23 12:44 PM  Result Value Ref Range   Sodium 132 (L) 135 - 145 mmol/L   Potassium 4.0 3.5 - 5.1 mmol/L   Chloride 98 98 - 111 mmol/L   CO2 21 (L) 22 - 32 mmol/L   Glucose, Bld 183 (H) 70 - 99 mg/dL   BUN 18 8 - 23 mg/dL   Creatinine, Ser 8.39 (H) 0.61 - 1.24 mg/dL   Calcium 9.0 8.9 - 89.6 mg/dL   Total Protein 7.7 6.5 - 8.1 g/dL   Albumin 3.8 3.5 - 5.0 g/dL   AST 35 15 - 41 U/L   ALT 23 0 - 44 U/L   Alkaline Phosphatase 76 38 - 126 U/L   Total Bilirubin 0.6 0.0 - 1.2 mg/dL   GFR, Estimated 48 (L) >60 mL/min   Anion gap 13 5 - 15  CBC with Differential     Status: Abnormal   Collection Time: 10/01/23 12:44 PM  Result Value Ref Range   WBC 10.6 (H) 4.0 - 10.5 K/uL   RBC 4.51 4.22 - 5.81 MIL/uL   Hemoglobin 13.4 13.0 - 17.0 g/dL   HCT 60.2 60.9 - 47.9 %   MCV 88.0 80.0 - 100.0 fL   MCH 29.7 26.0 - 34.0 pg   MCHC 33.8 30.0 - 36.0 g/dL   RDW 86.3 88.4 - 84.4 %   Platelets 200 150 - 400 K/uL   nRBC 0.0 0.0 - 0.2 %   Neutrophils Relative % 81 %   Neutro Abs 8.7 (H) 1.7 - 7.7 K/uL   Lymphocytes Relative 11 %   Lymphs Abs 1.1 0.7 - 4.0 K/uL   Monocytes Relative 7 %   Monocytes Absolute 0.7 0.1 - 1.0 K/uL   Eosinophils Relative 0 %   Eosinophils Absolute 0.0 0.0 - 0.5 K/uL   Basophils Relative 0 %   Basophils Absolute 0.0 0.0 - 0.1 K/uL   Immature Granulocytes 1 %   Abs  Immature Granulocytes 0.05 0.00 - 0.07 K/uL  Protime-INR     Status: None   Collection Time: 10/01/23 12:44 PM  Result Value Ref Range   Prothrombin Time 14.9 11.4 - 15.2 seconds   INR 1.1 0.8 - 1.2  APTT  Status: None   Collection Time: 10/01/23 12:44 PM  Result Value Ref Range   aPTT 31 24 - 36 seconds   Recent Results (from the past 720 hours)  Resp panel by RT-PCR (RSV, Flu A&B, Covid) Anterior Nasal Swab     Status: Abnormal   Collection Time: 10/01/23 12:44 PM   Specimen: Anterior Nasal Swab  Result Value Ref Range Status   SARS Coronavirus 2 by RT PCR NEGATIVE NEGATIVE Final    Comment: (NOTE) SARS-CoV-2 target nucleic acids are NOT DETECTED.  The SARS-CoV-2 RNA is generally detectable in upper respiratory specimens during the acute phase of infection. The lowest concentration of SARS-CoV-2 viral copies this assay can detect is 138 copies/mL. A negative result does not preclude SARS-Cov-2 infection and should not be used as the sole basis for treatment or other patient management decisions. A negative result may occur with  improper specimen collection/handling, submission of specimen other than nasopharyngeal swab, presence of viral mutation(s) within the areas targeted by this assay, and inadequate number of viral copies(<138 copies/mL). A negative result must be combined with clinical observations, patient history, and epidemiological information. The expected result is Negative.  Fact Sheet for Patients:  bloggercourse.com  Fact Sheet for Healthcare Providers:  seriousbroker.it  This test is no t yet approved or cleared by the [place]  FDA and  has been authorized for detection and/or diagnosis of SARS-CoV-2 by FDA under an Emergency Use Authorization (EUA). This EUA will remain  in effect (meaning this test can be used) for the duration of the COVID-19 declaration under Section 564(b)(1) of the Act,  21 U.S.C.section 360bbb-3(b)(1), unless the authorization is terminated  or revoked sooner.       Influenza A by PCR POSITIVE (A) NEGATIVE Final   Influenza B by PCR NEGATIVE NEGATIVE Final    Comment: (NOTE) The Xpert Xpress SARS-CoV-2/FLU/RSV plus assay is intended as an aid in the diagnosis of influenza from Nasopharyngeal swab specimens and should not be used as a sole basis for treatment. Nasal washings and aspirates are unacceptable for Xpert Xpress SARS-CoV-2/FLU/RSV testing.  Fact Sheet for Patients: bloggercourse.com  Fact Sheet for Healthcare Providers: seriousbroker.it  This test is not yet approved or cleared by the [place]  FDA and has been authorized for detection and/or diagnosis of SARS-CoV-2 by FDA under an Emergency Use Authorization (EUA). This EUA will remain in effect (meaning this test can be used) for the duration of the COVID-19 declaration under Section 564(b)(1) of the Act, 21 U.S.C. section 360bbb-3(b)(1), unless the authorization is terminated or revoked.     Resp Syncytial Virus by PCR NEGATIVE NEGATIVE Final    Comment: (NOTE) Fact Sheet for Patients: bloggercourse.com  Fact Sheet for Healthcare Providers: seriousbroker.it  This test is not yet approved or cleared by the [place]  FDA and has been authorized for detection and/or diagnosis of SARS-CoV-2 by FDA under an Emergency Use Authorization (EUA). This EUA will remain in effect (meaning this test can be used) for the duration of the COVID-19 declaration under Section 564(b)(1) of the Act, 21 U.S.C. section 360bbb-3(b)(1), unless the authorization is terminated or revoked.  Performed at [facility], [address] [place], [place] [address]    CBC:    Latest Ref Rng & Units 10/01/2023   12:44 PM  CBC  WBC 4.0 - 10.5 K/uL 10.6   Hemoglobin 13.0 - 17.0 g/dL 86.5    Hematocrit 60.9 - 52.0 % 39.7   Platelets 150 - 400  K/uL 200    Basic Metabolic Panel: Recent Labs  Lab 10/01/23 1244  NA 132*  K 4.0  CL 98  CO2 21*  GLUCOSE 183*  BUN 18  CREATININE 1.60*  CALCIUM 9.0   Creatinine: Lab Results  Component Value Date   CREATININE 1.60 (H) 10/01/2023   Liver Function Tests:    Latest Ref Rng & Units 10/01/2023   12:44 PM  Hepatic Function  Total Protein 6.5 - 8.1 g/dL 7.7   Albumin 3.5 - 5.0 g/dL 3.8   AST 15 - 41 U/L 35   ALT 0 - 44 U/L 23   Alk Phosphatase 38 - 126 U/L 76   Total Bilirubin 0.0 - 1.2 mg/dL 0.6    Coagulation Profile: Recent Labs  Lab 10/01/23 1244  INR 1.1   Cardiac Enzymes: No results for input(s): CKTOTAL, CKMB, CKMBINDEX, TROPONINI in the last 168 hours. BNP (last 3 results) No results for input(s): PROBNP in the last 8760 hours. HbA1C: No results for input(s): HGBA1C in the last 72 hours. Lipid Profile: No results for input(s): CHOL, HDL, LDLCALC, TRIG, CHOLHDL, LDLDIRECT in the last 72 hours.  Radiological Exams on Admission: DG Chest Port 1 View Result Date: 10/01/2023 CLINICAL DATA:  Fever. Questionable sepsis. Evaluate for abnormality. EXAM: PORTABLE CHEST 1 VIEW COMPARISON:  No prior chest radiographs are available for correlation. Limited correlation made with right shoulder radiographs 03/20/2014. FINDINGS: 1325 hours. Low lung volumes with mild atelectasis at both lung bases. There is no confluent airspace disease, pleural effusion or pneumothorax. Linear opacity peripherally in the right hemithorax may reflect scarring or fissural thickening. The heart size and mediastinal contours are normal for the degree of inspiration. The bones appear unremarkable. Telemetry leads overlie the chest. IMPRESSION: Low lung volumes with mild bibasilar atelectasis. No definite evidence of pneumonia. Electronically Signed   By: Elsie Perone M.D.   On: 10/01/2023 13:56    Data  Reviewed: Relevant notes from primary care and specialist visits, past discharge summaries as available in EHR, including Care Everywhere. Prior diagnostic testing as pertinent to current admission diagnoses, Updated medications and problem lists for reconciliation ED course, including vitals, labs, imaging, treatment and response to treatment,Triage notes, nursing and pharmacy notes and ED provider's notes Notable results as noted in HPI.Discussed case with EDMD/ ED APP/ or Specialty MD on call and as needed.  >>Assessment and Plan: -Shortness of breath/respiratory failure with hypoxia -Sepsis/influenza A -Generalized weakness   Author: Mario LULLA Blanch, MD 10/01/2023 4:52 PM  For on call review [email or web address].   Unresulted Labs (From admission, onward)     Start     Ordered   10/01/23 1244  Blood Culture (routine x 2)  (Septic presentation on arrival (screening labs, nursing and treatment orders for obvious sepsis))  BLOOD CULTURE X 2,   STAT      10/01/23 1244   10/01/23 1244  Urinalysis, w/ Reflex to Culture (Infection Suspected) -Urine, Clean Catch  (Septic presentation on arrival (screening labs, nursing and treatment orders for obvious sepsis))  ONCE - URGENT,   URGENT       Question:  Specimen Source  Answer:  Urine, Clean Catch   10/01/23 1244            Orders Placed This Encounter  Procedures   Critical Care   Resp panel by RT-PCR (RSV, Flu A&B, Covid) Anterior Nasal Swab   Blood Culture (routine x 2)   DG Chest Port 1 View   Comprehensive metabolic  panel   CBC with Differential   Protime-INR   APTT   Urinalysis, w/ Reflex to Culture (Infection Suspected) -Urine, Clean Catch   Diet NPO time specified   Document height and weight   Assess and Document Glasgow Coma Scale   Document vital signs within 1-hour of fluid bolus completion. Notify provider of abnormal vital signs despite fluid resuscitation.   DO NOT delay antibiotics if unable to obtain blood culture.    Refer to Sidebar Report: Sepsis Sidebar ED/IP   Notify provider for difficulties obtaining IV access.   Insert peripheral IV x 2   Initiate Carrier Fluid Protocol   Cardiac Monitoring Continuous x 48 hours Indications for use: Other; Other indications for use: sepsis   Code Sepsis activation.  This occurs automatically when order is signed and prioritizes pharmacy, lab, and radiology services for STAT collections and interventions.  If [facility] downtime, call [facility] [phone]) to activate Code Sepsis.   Consult to hospitalist   ED EKG   EKG 12-Lead   Admit to Inpatient (patient's expected length of stay will be greater than 2 midnights or inpatient only procedure)   Patient accepted for admission to med telemetry bed with continuous cardiac monitoring for influenza A/sepsis and respiratory failure.

## 2023-10-01 NOTE — H&P (Signed)
 History and Physical    Nathan Mays FMW:[id number] DOB: 1960/10/04 DOA: 10/01/2023  DOS: the patient was seen and examined on 10/01/2023  PCP: Boyd Jayson HERO, PA-C   Patient coming from: Home  I have personally briefly reviewed patient's old medical records in [facility]  HPI: 63 year old African-American male with no past medical history, takes no medications on a regular basis, presents to the ER today with fever, poor appetite, lethargy.  Had a reported syncopal episode while in triage.  Patient states that has been sick for the last couple days.  He works as a copy.  He has been having myalgias along with a cough.  His appetite has been poor.  No diarrhea.  She takes no medications.  He is noted to be hypoxic with room air saturations of 88% in triage.  Patient found to have influenza along with acute kidney injury and hyperglycemia.  [facility] hospital[facility] consulted for admission.  Significant Events: Admitted 10/01/2023 influenza A, acute hypoxic respiratory failure, acute kidney injury.   Significant Labs: Na 132, K 4.0, BUN 16, Scr 1.6, glucose 183 + for Influenza A WBC 10.6, hemoglobin 13.4, platelets of 200  Significant Imaging Studies: CXR shows Low lung volumes with mild bibasilar atelectasis. No definite evidence of pneumonia  Antibiotic Therapy: Anti-infectives (From admission, onward)    Start     Dose/Rate Route Frequency Ordered Stop   10/02/23 1000  cefTRIAXone  (ROCEPHIN ) 2 g in sodium chloride  0.9 % 100 mL IVPB  Status:  Discontinued        2 g 200 mL/hr over 30 Minutes Intravenous Every 24 hours 10/01/23 1651 10/01/23 2136   10/02/23 1000  azithromycin  (ZITHROMAX ) 500 mg in sodium chloride  0.9 % 250 mL IVPB  Status:  Discontinued        500 mg 250 mL/hr over 60 Minutes Intravenous Every 24 hours 10/01/23 1651 10/01/23 2136   10/01/23 2200  oseltamivir  (TAMIFLU ) capsule 30 mg        30 mg Oral 2 times daily 10/01/23 1651     10/01/23 1415  oseltamivir   (TAMIFLU ) capsule 75 mg        75 mg Oral  Once 10/01/23 1403 10/01/23 1410   10/01/23 1245  cefTRIAXone  (ROCEPHIN ) 2 g in sodium chloride  0.9 % 100 mL IVPB        2 g 200 mL/hr over 30 Minutes Intravenous Once 10/01/23 1244 10/01/23 1403   10/01/23 1245  azithromycin  (ZITHROMAX ) 500 mg in sodium chloride  0.9 % 250 mL IVPB        500 mg 250 mL/hr over 60 Minutes Intravenous  Once 10/01/23 1244 10/01/23 1403   10/01/23 1245  metroNIDAZOLE  (FLAGYL ) IVPB 500 mg        500 mg 100 mL/hr over 60 Minutes Intravenous  Once 10/01/23 1244 10/01/23 1403       Procedures:   Consultants:     ED Course: given IVF. CXR negative.   Review of Systems:  Review of Systems  Constitutional:  Positive for chills, fever and malaise/fatigue.  HENT: Negative.    Eyes: Negative.   Respiratory:  Positive for cough and shortness of breath.   Cardiovascular: Negative.   Gastrointestinal:  Negative for abdominal pain and diarrhea.  Musculoskeletal:  Positive for myalgias.  Skin: Negative.   Neurological:        Syncope in triage today.  Endo/Heme/Allergies: Negative.   Psychiatric/Behavioral: Negative.    All other systems reviewed and are negative.   History reviewed. No  pertinent past medical history.  History reviewed. No pertinent surgical history.   reports that he has never smoked. He does not have any smokeless tobacco history on file. He reports that he does not drink alcohol and does not use drugs.  No Known Allergies  History reviewed. No pertinent family history.  Prior to Admission medications   Medication Sig Start Date End Date Taking? Authorizing Provider  diazepam  (VALIUM ) 5 MG tablet Take 1 tablet (5 mg total) by mouth every 6 (six) hours as needed for muscle spasms (spasms). 03/20/14   Elpidio Lamp, MD  HYDROcodone -acetaminophen  (NORCO/VICODIN) 5-325 MG per tablet Take 1 tablet by mouth every 6 (six) hours as needed for moderate pain. 03/20/14   Elpidio Lamp, MD  ibuprofen   (ADVIL ,MOTRIN ) 800 MG tablet Take 1 tablet (800 mg total) by mouth 3 (three) times daily. 03/20/14   Elpidio Lamp, MD    Physical Exam: Vitals:   10/01/23 1715 10/01/23 1730 10/01/23 1745 10/01/23 1800  BP: 104/70 103/71 105/69 100/63  Pulse: 79 83 73 86  Resp: 20 (!) 21 20 15   Temp:      TempSrc:      SpO2: 100% 99% 99% 99%  Weight:      Height:   5' 4 (1.626 m)     Physical Exam Vitals and nursing note reviewed.  Constitutional:      General: He is not in acute distress.    Appearance: Normal appearance. He is obese. He is not toxic-appearing or diaphoretic.  HENT:     Head: Normocephalic and atraumatic.     Nose: Nose normal.  Eyes:     General: No scleral icterus. Cardiovascular:     Rate and Rhythm: Normal rate and regular rhythm.     Pulses: Normal pulses.  Pulmonary:     Effort: No respiratory distress.     Comments: Coarse BS bilaterally. No wheezing Abdominal:     General: Abdomen is protuberant. Bowel sounds are normal. There is no distension.     Palpations: Abdomen is soft.     Tenderness: There is no abdominal tenderness. There is no guarding or rebound.  Musculoskeletal:     Right lower leg: No edema.     Left lower leg: No edema.  Skin:    General: Skin is warm and dry.     Capillary Refill: Capillary refill takes less than 2 seconds.  Neurological:     General: No focal deficit present.     Mental Status: He is alert and oriented to person, place, and time.      Labs on Admission: I have personally reviewed following labs and imaging studies  CBC: Recent Labs  Lab 10/01/23 1244  WBC 10.6*  NEUTROABS 8.7*  HGB 13.4  HCT 39.7  MCV 88.0  PLT 200   Basic Metabolic Panel: Recent Labs  Lab 10/01/23 1244  NA 132*  K 4.0  CL 98  CO2 21*  GLUCOSE 183*  BUN 18  CREATININE 1.60*  CALCIUM 9.0   GFR: Estimated Creatinine Clearance: 48.3 mL/min (A) (by C-G formula based on SCr of 1.6 mg/dL (H)). Liver Function Tests: Recent Labs  Lab  10/01/23 1244  AST 35  ALT 23  ALKPHOS 76  BILITOT 0.6  PROT 7.7  ALBUMIN 3.8   Coagulation Profile: Recent Labs  Lab 10/01/23 1244  INR 1.1   Radiological Exams on Admission: I have personally reviewed images DG Chest Port 1 View Result Date: 10/01/2023 CLINICAL DATA:  Fever. Questionable sepsis.  Evaluate for abnormality. EXAM: PORTABLE CHEST 1 VIEW COMPARISON:  No prior chest radiographs are available for correlation. Limited correlation made with right shoulder radiographs 03/20/2014. FINDINGS: 1325 hours. Low lung volumes with mild atelectasis at both lung bases. There is no confluent airspace disease, pleural effusion or pneumothorax. Linear opacity peripherally in the right hemithorax may reflect scarring or fissural thickening. The heart size and mediastinal contours are normal for the degree of inspiration. The bones appear unremarkable. Telemetry leads overlie the chest. IMPRESSION: Low lung volumes with mild bibasilar atelectasis. No definite evidence of pneumonia. Electronically Signed   By: Elsie Perone M.D.   On: 10/01/2023 13:56   EKG: My personal interpretation of EKG shows: NSR  Assessment/Plan Principal Problem:   Acute respiratory failure with hypoxia (HCC) Active Problems:   Influenza A   Hyperglycemia   AKI (acute kidney injury) (HCC)   Dehydration with hyponatremia   Obesity, Class II, BMI 35-39.9    Assessment and Plan: * Acute respiratory failure with hypoxia (HCC) Due to influenza A.  Pt not on home O2. Incentive spirometry. Prn nebs. Wean O2 to keep sats >92%  Dehydration with hyponatremia Continue with IVF. Repeat BMP in AM.  AKI (acute kidney injury) (HCC) Due to influenza A and poor po intake. Last Scr was from Jan 2022. Scr was 1.23. continue with IVF overnight. Repeat BMP in AM.  Hyperglycemia Check A1c.   Influenza A Continue tamiflu  and supportive care. Stop abx.  Obesity, Class II, BMI 35-39.9 BMI 33.95   DVT prophylaxis: SQ  Heparin  Code Status: Full Code Family Communication: no family at bedside. Pt is decisional  Disposition Plan: return home  Consults called: none  Admission status: Inpatient, Med-Surg   Camellia Door, DO [facility] 10/01/2023, 9:57 PM

## 2023-10-01 NOTE — Assessment & Plan Note (Signed)
 Due to influenza A and poor po intake. Last Scr was from Jan 2022. Scr was 1.23. continue with IVF overnight. Repeat BMP in AM.

## 2023-10-01 NOTE — Hospital Course (Addendum)
 Patient is a 63 year old African-American male with no documented past medical history presented to hospital with fever poor appetite and lethargy.  He also had syncopal episode.  Works as a scientist, research (medical).  He has been having some myalgia with cough recently.  In the triage he was noted to have pulse ox of 88% on room air, was febrile with a temperature of 103.2 F.  Initial labs showed WBC at 10.6.  Creatinine elevated at 1.6 with sodium of 132.  Respiratory panel was positive for influenza A.  HIV nonreactive.  Chest x-ray showed low lung volumes with mild bibasilar atelectasis.  No definite evidence of pneumonia.  Hemoglobin was 11.5. SABRA  Blood cultures were sent from the ED and the patient was admitted to hospital for influenza with AKI and hyperglycemia.  Assessment and Plan: * Acute respiratory failure with hypoxia Due to influenza A.  Patient had hypoxia on room air on presentation with dyspnea.  Continue as needed nebs.  Continue Tamiflu .  Blood cultures negative in less than 12 hours.  Currently on 2 L of oxygen by nasal cannula.  Temperature max of 103.2 F.  Volume depletion with mild hyponatremia. Improved sodium level to 136 today.   AKI (acute kidney injury) (HCC) Fluids and poor oral intake.  Last Scr was from Jan 2022. Scr was 1.23.  COVID IV fluids.  Creatinine today at 1.5 from initial 1.6.    Hyperglycemia likely new diagnosis of diabetes type 2. Hemoglobin A1c of 8.0.  Lifestyle modification advised.  No medications on board.  Might need diabetic medication on discharge.   Influenza A Continue tamiflu  and supportive care. Stop abx.   Obesity, Class II, BMI 35-39.9 BMI 33.95 Would benefit from weight loss as outpatient.

## 2023-10-01 NOTE — ED Provider Notes (Addendum)
 [place] Provider Note   CSN: [id number] Arrival date & time: 10/01/23  1158     History  Chief Complaint  Patient presents with   Fever    Nathan Mays is a 63 y.o. male.  Patient is a 63 year old male presenting via private vehicle with his son for generalized weakness and cough.  The patient's wife states that yesterday he was sent home from work for feelings of unwellness associated with a cough.  She does admit to 1 episode of diarrhea.  On arrival patient syncopized in triage.  He is minimally verbal at this time but is following commands.  Will obtain rest of history after initial workup.  The history is provided by the patient. No language interpreter was used.  Fever Associated symptoms: cough and diarrhea   Associated symptoms: no chest pain, no chills, no dysuria, no ear pain, no rash, no sore throat and no vomiting        Home Medications Prior to Admission medications   Medication Sig Start Date End Date Taking? Authorizing Provider  diazepam  (VALIUM ) 5 MG tablet Take 1 tablet (5 mg total) by mouth every 6 (six) hours as needed for muscle spasms (spasms). 03/20/14   Elpidio Lamp, MD  HYDROcodone -acetaminophen  (NORCO/VICODIN) 5-325 MG per tablet Take 1 tablet by mouth every 6 (six) hours as needed for moderate pain. 03/20/14   Elpidio Lamp, MD  ibuprofen  (ADVIL ,MOTRIN ) 800 MG tablet Take 1 tablet (800 mg total) by mouth 3 (three) times daily. 03/20/14   Elpidio Lamp, MD      Allergies    Patient has no known allergies.    Review of Systems   Review of Systems  Constitutional:  Positive for fever. Negative for chills.  HENT:  Negative for ear pain and sore throat.   Eyes:  Negative for pain and visual disturbance.  Respiratory:  Positive for cough. Negative for shortness of breath.   Cardiovascular:  Negative for chest pain and palpitations.  Gastrointestinal:  Positive for diarrhea. Negative for abdominal pain and  vomiting.  Genitourinary:  Negative for dysuria and hematuria.  Musculoskeletal:  Negative for arthralgias and back pain.  Skin:  Negative for color change and rash.  Neurological:  Negative for seizures and syncope.  All other systems reviewed and are negative.   Physical Exam Updated Vital Signs BP 96/65   Pulse (!) 101   Temp 100.2 F (37.9 C) (Oral)   Resp (!) 22   Wt 89.7 kg   SpO2 93%   BMI 33.95 kg/m  Physical Exam Vitals and nursing note reviewed.  Constitutional:      Appearance: He is well-developed. He is ill-appearing.  HENT:     Head: Normocephalic and atraumatic.  Eyes:     Conjunctiva/sclera: Conjunctivae normal.  Cardiovascular:     Rate and Rhythm: Normal rate and regular rhythm.     Heart sounds: No murmur heard. Pulmonary:     Effort: Pulmonary effort is normal. No respiratory distress.     Breath sounds: Normal breath sounds.  Abdominal:     Palpations: Abdomen is soft.     Tenderness: There is no abdominal tenderness.  Musculoskeletal:        General: No swelling.     Cervical back: Neck supple.  Skin:    General: Skin is warm and dry.     Capillary Refill: Capillary refill takes less than 2 seconds.  Neurological:     Mental Status: He is  alert.     GCS: GCS eye subscore is 2. GCS verbal subscore is 3. GCS motor subscore is 6.  Psychiatric:        Mood and Affect: Mood normal.     ED Results / Procedures / Treatments   Labs (all labs ordered are listed, but only abnormal results are displayed) Labs Reviewed  RESP PANEL BY RT-PCR (RSV, FLU A&B, COVID)  RVPGX2 - Abnormal; Notable for the following components:      Result Value   Influenza A by PCR POSITIVE (*)    All other components within normal limits  COMPREHENSIVE METABOLIC PANEL - Abnormal; Notable for the following components:   Sodium 132 (*)    CO2 21 (*)    Glucose, Bld 183 (*)    Creatinine, Ser 1.60 (*)    GFR, Estimated 48 (*)    All other components within normal limits   CBC WITH DIFFERENTIAL/PLATELET - Abnormal; Notable for the following components:   WBC 10.6 (*)    Neutro Abs 8.7 (*)    All other components within normal limits  CULTURE, BLOOD (ROUTINE X 2)  CULTURE, BLOOD (ROUTINE X 2)  LACTIC ACID, PLASMA  PROTIME-INR  APTT  LACTIC ACID, PLASMA  URINALYSIS, W/ REFLEX TO CULTURE (INFECTION SUSPECTED)  TROPONIN I (HIGH SENSITIVITY)  TROPONIN I (HIGH SENSITIVITY)    EKG EKG Interpretation Date/Time:  Thursday October 01 2023 12:47:01 EST Ventricular Rate:  99 PR Interval:  129 QRS Duration:  96 QT Interval:  336 QTC Calculation: 432 R Axis:   20  Text Interpretation: Sinus rhythm Probable left atrial enlargement Borderline T wave abnormalities Minimal ST elevation, anterior leads Confirmed by Elnor Hila ([id number]) on 10/01/2023 2:59:19 PM  Radiology DG Chest Port 1 View Result Date: 10/01/2023 CLINICAL DATA:  Fever. Questionable sepsis. Evaluate for abnormality. EXAM: PORTABLE CHEST 1 VIEW COMPARISON:  No prior chest radiographs are available for correlation. Limited correlation made with right shoulder radiographs 03/20/2014. FINDINGS: 1325 hours. Low lung volumes with mild atelectasis at both lung bases. There is no confluent airspace disease, pleural effusion or pneumothorax. Linear opacity peripherally in the right hemithorax may reflect scarring or fissural thickening. The heart size and mediastinal contours are normal for the degree of inspiration. The bones appear unremarkable. Telemetry leads overlie the chest. IMPRESSION: Low lung volumes with mild bibasilar atelectasis. No definite evidence of pneumonia. Electronically Signed   By: Elsie Perone M.D.   On: 10/01/2023 13:56    Procedures .Critical Care  Performed by: Elnor Hila SQUIBB, DO Authorized by: Elnor Hila SQUIBB, DO   Critical care provider statement:    Critical care time (minutes):  101   Critical care was necessary to treat or prevent imminent or life-threatening deterioration  of the following conditions:  Sepsis   Critical care was time spent personally by me on the following activities:  Development of treatment plan with patient or surrogate, discussions with consultants, evaluation of patient's response to treatment, examination of patient, ordering and review of laboratory studies, ordering and review of radiographic studies, ordering and performing treatments and interventions, pulse oximetry, re-evaluation of patient's condition and review of old charts   Care discussed with: admitting provider       Medications Ordered in ED Medications  lactated ringers  infusion ( Intravenous New Bag/Given 10/01/23 1257)  sodium chloride  0.9 % bolus 1,000 mL (has no administration in time range)  sodium chloride  0.9 % bolus 1,000 mL (0 mLs Intravenous Stopped 10/01/23 1403)  cefTRIAXone  (ROCEPHIN )  2 g in sodium chloride  0.9 % 100 mL IVPB (0 g Intravenous Stopped 10/01/23 1403)  azithromycin  (ZITHROMAX ) 500 mg in sodium chloride  0.9 % 250 mL IVPB (0 mg Intravenous Stopped 10/01/23 1403)  metroNIDAZOLE  (FLAGYL ) IVPB 500 mg (0 mg Intravenous Stopped 10/01/23 1403)  ketorolac  (TORADOL ) 15 MG/ML injection 15 mg (15 mg Intravenous Given 10/01/23 1300)  acetaminophen  (TYLENOL ) suppository 650 mg (650 mg Rectal Given 10/01/23 1300)  oseltamivir  (TAMIFLU ) capsule 75 mg (75 mg Oral Given 10/01/23 1410)  ipratropium-albuterol  (DUONEB) 0.5-2.5 (3) MG/3ML nebulizer solution 3 mL (3 mLs Nebulization Given 10/01/23 1420)    ED Course/ Medical Decision Making/ A&P                                 Medical Decision Making Amount and/or Complexity of Data Reviewed Labs: ordered. Radiology: ordered.  Risk OTC drugs. Prescription drug management. Decision regarding hospitalization.   63 year old male presenting via private vehicle with his son for generalized weakness and cough.  The patient's wife states that yesterday he was sent home from work for feelings of unwellness associated with a cough.   She does admit to 1 episode of diarrhea.  On arrival patient syncopized in triage.  He is minimally verbal at this time but is following commands.  Will obtain rest of history after initial workup.  Patient brought to resuscitation bay in wheelchair.  GCS 11.  POC glucose 180.  Temperature greater than 103.2 F orally code sepsis activated.  Hypoxic at 89% on room air.  Blood cultures and lactic acid sent.  1 L IV fluids started.  Toradol  and rectal Tylenol  ordered for fever.  Broad spectrum antibiotics covering for both lung pathology and intra-abdominal pathology ordered loading Rocephin , azithromycin , and Flagyl .  Laboratory studies and imaging pending.   Influenza positive.  Chest x-ray demonstrates no focal opacities.  Low suspicion bacterial superinfection.  Minimal leukocytosis at 10.6.  Creatinine at 1.60 likely secondary to dehydration.  IV fluids currently running.  Will discontinue antibiotic therapy at this time.  Recommending admission for acute respiratory failure with hypoxia, syncope with collapse, secondary to influenza.  Went to the bedroom to reevaluate the patient after receiving first IV fluid bolus and patient's bolus is not running due to his wrist being bent where the IV is placed.  I worked with his nurse to ensure that the wrist remained straight and that we accessed the second IV for additional fluid bolus.  I spoke with the admitting hospitalist service who is requesting to call us  back in an hour after the patient has received both fluid boluses to make sure he is not septic shock and that he is fluid responsive.  Patient signed out to oncoming provider while awaiting fluid boluses.        Final Clinical Impression(s) / ED Diagnoses Final diagnoses:  Syncope, unspecified syncope type  Fever, unspecified fever cause  Cough, unspecified type  Diarrhea, unspecified type  Sepsis, due to unspecified organism, unspecified whether acute organ dysfunction present [facility])   Influenza  Acute respiratory failure with hypoxia [facility])    Rx / DC Orders ED Discharge Orders     None         Elnor Bernarda SQUIBB, DO 10/01/23 1539    Elnor Bernarda P, DO 10/01/23 1542

## 2023-10-02 DIAGNOSIS — J9601 Acute respiratory failure with hypoxia: Secondary | ICD-10-CM | POA: Diagnosis not present

## 2023-10-02 LAB — HEMOGLOBIN A1C
Hgb A1c MFr Bld: 8 % — ABNORMAL HIGH (ref 4.8–5.6)
Mean Plasma Glucose: 182.9 mg/dL

## 2023-10-02 LAB — COMPREHENSIVE METABOLIC PANEL
ALT: 21 U/L (ref 0–44)
AST: 40 U/L (ref 15–41)
Albumin: 2.8 g/dL — ABNORMAL LOW (ref 3.5–5.0)
Alkaline Phosphatase: 61 U/L (ref 38–126)
Anion gap: 8 (ref 5–15)
BUN: 17 mg/dL (ref 8–23)
CO2: 24 mmol/L (ref 22–32)
Calcium: 7.7 mg/dL — ABNORMAL LOW (ref 8.9–10.3)
Chloride: 104 mmol/L (ref 98–111)
Creatinine, Ser: 1.56 mg/dL — ABNORMAL HIGH (ref 0.61–1.24)
GFR, Estimated: 50 mL/min — ABNORMAL LOW (ref >60)
Glucose, Bld: 120 mg/dL — ABNORMAL HIGH (ref 70–99)
Potassium: 3.9 mmol/L (ref 3.5–5.1)
Sodium: 136 mmol/L (ref 135–145)
Total Bilirubin: 0.5 mg/dL (ref 0.0–1.2)
Total Protein: 5.8 g/dL — ABNORMAL LOW (ref 6.5–8.1)

## 2023-10-02 LAB — HIV ANTIBODY (ROUTINE TESTING W REFLEX): HIV Screen 4th Generation wRfx: NONREACTIVE

## 2023-10-02 LAB — CBC
HCT: 34.5 % — ABNORMAL LOW (ref 39.0–52.0)
Hemoglobin: 11.5 g/dL — ABNORMAL LOW (ref 13.0–17.0)
MCH: 29.9 pg (ref 26.0–34.0)
MCHC: 33.3 g/dL (ref 30.0–36.0)
MCV: 89.8 fL (ref 80.0–100.0)
Platelets: 161 10*3/uL (ref 150–400)
RBC: 3.84 MIL/uL — ABNORMAL LOW (ref 4.22–5.81)
RDW: 13.9 % (ref 11.5–15.5)
WBC: 7.1 10*3/uL (ref 4.0–10.5)
nRBC: 0 % (ref 0.0–0.2)

## 2023-10-02 LAB — RESPIRATORY PANEL BY PCR

## 2023-10-02 LAB — MAGNESIUM: Magnesium: 1.8 mg/dL (ref 1.7–2.4)

## 2023-10-02 LAB — GLUCOSE, CAPILLARY
Glucose-Capillary: 141 mg/dL — ABNORMAL HIGH (ref 70–99)
Glucose-Capillary: 129 mg/dL — ABNORMAL HIGH (ref 70–99)
Glucose-Capillary: 141 mg/dL — ABNORMAL HIGH (ref 70–99)

## 2023-10-02 MED ORDER — LIVING WELL WITH DIABETES BOOK
Freq: Once | Status: AC
  Filled 2023-10-02: qty 1

## 2023-10-02 MED ORDER — ALBUTEROL SULFATE (2.5 MG/3ML) 0.083% IN NEBU
2.5000 mg | INHALATION_SOLUTION | RESPIRATORY_TRACT | Status: DC | PRN
  Administered 2023-10-03: 2.5 mg via RESPIRATORY_TRACT
  Filled 2023-10-02: qty 3

## 2023-10-02 MED ORDER — INSULIN ASPART 100 UNIT/ML IJ SOLN
0.0000 [IU] | Freq: Three times a day (TID) | INTRAMUSCULAR | Status: DC
  Administered 2023-10-02: 1 [IU] via SUBCUTANEOUS
  Administered 2023-10-03 – 2023-10-04 (×3): 2 [IU] via SUBCUTANEOUS

## 2023-10-02 NOTE — Progress Notes (Signed)
 Mobility Specialist Progress Note:   10/02/23 1601  Mobility  Activity Ambulated with assistance in hallway  Level of Assistance Standby assist, set-up cues, supervision of patient - no hands on  Assistive Device None  Distance Ambulated (ft) 550 ft  Activity Response Tolerated well  Mobility Referral Yes  Mobility visit 1 Mobility  Mobility Specialist Start Time (ACUTE ONLY) 1445  Mobility Specialist Stop Time (ACUTE ONLY) 1455  Mobility Specialist Time Calculation (min) (ACUTE ONLY) 10 min   Pt received in bed, agreeable to mobility. Pt was able to tolerate increased gait distance without any assistance. Ambulated on RA. SpO2 in mid 90's. No complaints stated. Pt left in chair with call bell in reach and all needs met.   Brown Husband  Mobility Specialist Please contact via [employer] office at [phone]

## 2023-10-02 NOTE — Progress Notes (Signed)
  RD consulted for nutrition education regarding diabetes.   Patient typically has eggs, bacon, and grits for breakfast a Sandwich for lunch and fried chicken for dinner. Snacks on cookies and snack cakes. Drinks 1 soda per day, no other sugar sweetened beverages. Does not endorse weight loss. Specifically focused on what diabetes was and the importance of managing blood sugars with insulin . . Referred to outpatient dietitians for more information if desired.   Lab Results  Component Value Date   HGBA1C 8.0 (H) 10/01/2023    RD provided Plate method for diabetes handout from the Academy of [employer] and Dietetics. Discussed different food groups and their effects on blood sugar, emphasizing carbohydrate-containing foods. Provided list of carbohydrates and recommended serving sizes of common foods.  Discussed importance of controlled and consistent carbohydrate intake throughout the day. Provided examples of ways to balance meals/snacks and encouraged intake of high-fiber, whole grain complex carbohydrates. Teach back method used.  Expect Fair compliance.  Body mass index is 33.95 kg/m. Pt meets criteria for Obese based on current BMI.  Current diet order is Carb modified, no meals documented at this time. Labs and medications reviewed. No further nutrition interventions warranted at this time. RD contact information provided. If additional nutrition issues arise, please re-consult RD.  Olivia Kenning, RD Registered Dietitian  See Amion for more information

## 2023-10-02 NOTE — Inpatient Diabetes Management (Addendum)
 Inpatient Diabetes Program Recommendations  AACE/ADA: New Consensus Statement on Inpatient Glycemic Control (2015)  Target Ranges:  Prepandial:   less than 140 mg/dL      Peak postprandial:   less than 180 mg/dL (1-2 hours)      Critically ill patients:  140 - 180 mg/dL   Lab Results  Component Value Date   HGBA1C 8.0 (H) 10/01/2023   Diabetes history: New Onset Current orders for Inpatient glycemic control: Novolog  0-9 units tid correction  Inpatient Diabetes Program Recommendations:   Received consult regarding new onset diabetes. Ordered: Living Well with Diabetes booklet And consult to dietician. Plan to speak with patient today.  1:00 pm Spoke with pt about new diagnosis. Discussed A1C results with them and explained what an A1C is, basic pathophysiology of DM Type 2, basic home care, basic diabetes diet nutrition principles, importance of checking CBGs and maintaining good CBG control to prevent long-term and short-term complications. Reviewed signs and symptoms of hyperglycemia and hypoglycemia and how to treat hypoglycemia at home. Also reviewed blood sugar goals at home.  RNs to provide ongoing basic DM education at bedside with this patient. Have ordered educational booklet, insulin  starter kit, and DM videos. Have also placed RD consult for DM diet education for this patient.    Discharge Recommendations: Other recommendations: Consider Metformin  500 mg bid & possibly GLP1 Supply/Referral recommendations: Glucometer Test strips Lancet device Lancets Referral to Nutrition & Diab Services   Use Adult Diabetes Insulin  Treatment Post Discharge order set.  Thank you, Goldie Tregoning E. Errol Ala, RN, MSN, CDCES  Diabetes Coordinator Inpatient Glycemic Control Team Team Pager [phone] (8am-5pm) 10/02/2023 9:08 AM

## 2023-10-02 NOTE — Progress Notes (Signed)
   10/02/23 1027  TOC Brief Assessment  Insurance and Status Reviewed  Patient has primary care physician Yes  Home environment has been reviewed spouse  Prior level of function: independent  Prior/Current Home Services No current home services  Social Drivers of Health Review SDOH reviewed no interventions necessary  Readmission risk has been reviewed No  Transition of care needs no transition of care needs at this time     Report in progression; patient currently on oxygen at 2L [place], saturations 98%. Plan to wean oxygen today. Patient does not have home oxygen. If needed will need oxygen ambulation saturation note and order .     Transition of Care Department [facility]) has reviewed patient and will continue to monitor patient advancement through interdisciplinary progression rounds. If new patient transition needs arise, please place a TOC consult.

## 2023-10-02 NOTE — Progress Notes (Signed)
 PROGRESS NOTE  Nathan Mays FMW:[id number] DOB: [birth year] DOA: 10/01/2023 PCP: Boyd Jayson HERO, PA-C   LOS: 1 day   Brief narrative:  Patient is a 63 year old African-American male with no documented past medical history presented to hospital with fever poor appetite and lethargy.  He also had syncopal episode.  Works as a scientist, research (medical).  He has been having some myalgia with cough recently.  In the triage he was noted to have pulse ox of 88% on room air, was febrile with a temperature of 103.2 F.  Initial labs showed WBC at 10.6.  Creatinine elevated at 1.6 with sodium of 132.  Respiratory panel was positive for influenza A.  HIV nonreactive.  Chest x-ray showed low lung volumes with mild bibasilar atelectasis.  No definite evidence of pneumonia.  Hemoglobin was 11.5. SABRA  Blood cultures were sent from the ED and the patient was admitted to hospital for influenza with AKI and hyperglycemia.  Assessment/Plan: Principal Problem:   Acute respiratory failure with hypoxia (HCC) Active Problems:   Influenza A   Hyperglycemia   AKI (acute kidney injury) (HCC)   Dehydration with hyponatremia   Obesity, Class II, BMI 35-39.9   Assessment and Plan: * Acute respiratory failure with hypoxia Due to influenza A.  Patient had hypoxia on room air on presentation with dyspnea.  Continue as needed nebs.  Continue Tamiflu .  Blood cultures negative in less than 12 hours.  Currently on 2 L of oxygen by nasal cannula.  Will continue to wean oxygen as able.  Temperature max of 103.2 F.  Volume depletion with mild hyponatremia. Improved sodium level to 136 today.  Check BMP in AM.   AKI (acute kidney injury) (HCC) Received IV fluids and poor oral intake.  Last Scr was from Jan 2022. Scr was 1.23.  COVID IV fluids.  Creatinine today at 1.5 from initial 1.6.  Encourage oral intake  Hyperglycemia likely new diagnosis of diabetes type 2. Hemoglobin A1c of 8.0.  Lifestyle modification advised.  No medications on  board.  Might need diabetic medication on discharge.  Will get diabetic coordinator involved for diabetic education.  Will put the patient on sliding scale insulin  for now.   Influenza A Continue tamiflu  and supportive care. Stop abx.   Obesity, Class II, BMI 35-39.9 BMI 33.95 Would benefit from weight loss as outpatient.  Lifestyle modification advised  DVT prophylaxis: heparin  injection 5,000 Units Start: 10/01/23 2300 SCDs Start: 10/01/23 2215   Disposition: Home likely in 1 to 2 days  Status is: Inpatient Remains inpatient appropriate because: On supplemental oxygen, pending clinical improvement, new diabetes    Code Status:     Code Status: Full Code.  Family Communication: None at bedside  Consultants: None  Procedures: None  Anti-infectives:  Tamiflu   Anti-infectives (From admission, onward)    Start     Dose/Rate Route Frequency Ordered Stop   10/02/23 1000  cefTRIAXone  (ROCEPHIN ) 2 g in sodium chloride  0.9 % 100 mL IVPB  Status:  Discontinued        2 g 200 mL/hr over 30 Minutes Intravenous Every 24 hours 10/01/23 1651 10/01/23 2136   10/02/23 1000  azithromycin  (ZITHROMAX ) 500 mg in sodium chloride  0.9 % 250 mL IVPB  Status:  Discontinued        500 mg 250 mL/hr over 60 Minutes Intravenous Every 24 hours 10/01/23 1651 10/01/23 2136   10/01/23 2200  oseltamivir  (TAMIFLU ) capsule 30 mg        30 mg Oral 2  times daily 10/01/23 1651     10/01/23 1415  oseltamivir  (TAMIFLU ) capsule 75 mg        75 mg Oral  Once 10/01/23 1403 10/01/23 1410   10/01/23 1245  cefTRIAXone  (ROCEPHIN ) 2 g in sodium chloride  0.9 % 100 mL IVPB        2 g 200 mL/hr over 30 Minutes Intravenous Once 10/01/23 1244 10/01/23 1403   10/01/23 1245  azithromycin  (ZITHROMAX ) 500 mg in sodium chloride  0.9 % 250 mL IVPB        500 mg 250 mL/hr over 60 Minutes Intravenous  Once 10/01/23 1244 10/01/23 1403   10/01/23 1245  metroNIDAZOLE  (FLAGYL ) IVPB 500 mg        500 mg 100 mL/hr over 60 Minutes  Intravenous  Once 10/01/23 1244 10/01/23 1403       Subjective: Today, patient was seen and examined at bedside.  Complains of cough congestion and shortness of breath.  Discussed about diabetes lifestyle modification.  Objective: Vitals:   10/02/23 0425 10/02/23 0726  BP: 102/66 99/61  Pulse: 93 91  Resp: 20 16  Temp: (!) 101.1 F (38.4 C) 100 F (37.8 C)  SpO2: 95% 98%    Intake/Output Summary (Last 24 hours) at 10/02/2023 0841 Last data filed at 10/01/2023 1655 Gross per 24 hour  Intake 2431.39 ml  Output --  Net 2431.39 ml   Filed Weights   10/01/23 1248  Weight: 89.7 kg   Body mass index is 33.95 kg/m.   Physical Exam: GENERAL: Patient is alert awake and oriented. Not in obvious distress.  Obese built, nasal canula in Place HENT: No scleral pallor or icterus. Pupils equally reactive to light. Oral mucosa is moist NECK: is supple, no gross swelling noted. CHEST: Coarse breath sounds bilaterally diminished breath sounds bilaterally. CVS: S1 and S2 heard, no murmur. Regular rate and rhythm.  ABDOMEN: Soft, non-tender, bowel sounds are present. EXTREMITIES: No edema. CNS: Cranial nerves are intact. No focal motor deficits. SKIN: warm and dry without rashes.  Data Review: I have personally reviewed the following laboratory data and studies,  CBC: Recent Labs  Lab 10/01/23 1244 10/01/23 2343  WBC 10.6* 7.1  NEUTROABS 8.7*  --   HGB 13.4 11.5*  HCT 39.7 34.5*  MCV 88.0 89.8  PLT 200 161   Basic Metabolic Panel: Recent Labs  Lab 10/01/23 1244 10/01/23 2341  NA 132* 136  K 4.0 3.9  CL 98 104  CO2 21* 24  GLUCOSE 183* 120*  BUN 18 17  CREATININE 1.60* 1.56*  CALCIUM 9.0 7.7*  MG  --  1.8   Liver Function Tests: Recent Labs  Lab 10/01/23 1244 10/01/23 2341  AST 35 40  ALT 23 21  ALKPHOS 76 61  BILITOT 0.6 0.5  PROT 7.7 5.8*  ALBUMIN 3.8 2.8*   No results for input(s): LIPASE, AMYLASE in the last 168 hours. No results for input(s):  AMMONIA in the last 168 hours. Cardiac Enzymes: No results for input(s): CKTOTAL, CKMB, CKMBINDEX, TROPONINI in the last 168 hours. BNP (last 3 results) No results for input(s): BNP in the last 8760 hours.  ProBNP (last 3 results) No results for input(s): PROBNP in the last 8760 hours.  CBG: No results for input(s): GLUCAP in the last 168 hours. Recent Results (from the past 240 hours)  Resp panel by RT-PCR (RSV, Flu A&B, Covid) Anterior Nasal Swab     Status: Abnormal   Collection Time: 10/01/23 12:44 PM   Specimen: Anterior  Nasal Swab  Result Value Ref Range Status   SARS Coronavirus 2 by RT PCR NEGATIVE NEGATIVE Final    Comment: (NOTE) SARS-CoV-2 target nucleic acids are NOT DETECTED.  The SARS-CoV-2 RNA is generally detectable in upper respiratory specimens during the acute phase of infection. The lowest concentration of SARS-CoV-2 viral copies this assay can detect is 138 copies/mL. A negative result does not preclude SARS-Cov-2 infection and should not be used as the sole basis for treatment or other patient management decisions. A negative result may occur with  improper specimen collection/handling, submission of specimen other than nasopharyngeal swab, presence of viral mutation(s) within the areas targeted by this assay, and inadequate number of viral copies(<138 copies/mL). A negative result must be combined with clinical observations, patient history, and epidemiological information. The expected result is Negative.  Fact Sheet for Patients:  bloggercourse.com  Fact Sheet for Healthcare Providers:  seriousbroker.it  This test is no t yet approved or cleared by the [place]  FDA and  has been authorized for detection and/or diagnosis of SARS-CoV-2 by FDA under an Emergency Use Authorization (EUA). This EUA will remain  in effect (meaning this test can be used) for the duration of the COVID-19  declaration under Section 564(b)(1) of the Act, 21 U.S.C.section 360bbb-3(b)(1), unless the authorization is terminated  or revoked sooner.       Influenza A by PCR POSITIVE (A) NEGATIVE Final   Influenza B by PCR NEGATIVE NEGATIVE Final    Comment: (NOTE) The Xpert Xpress SARS-CoV-2/FLU/RSV plus assay is intended as an aid in the diagnosis of influenza from Nasopharyngeal swab specimens and should not be used as a sole basis for treatment. Nasal washings and aspirates are unacceptable for Xpert Xpress SARS-CoV-2/FLU/RSV testing.  Fact Sheet for Patients: bloggercourse.com  Fact Sheet for Healthcare Providers: seriousbroker.it  This test is not yet approved or cleared by the [place]  FDA and has been authorized for detection and/or diagnosis of SARS-CoV-2 by FDA under an Emergency Use Authorization (EUA). This EUA will remain in effect (meaning this test can be used) for the duration of the COVID-19 declaration under Section 564(b)(1) of the Act, 21 U.S.C. section 360bbb-3(b)(1), unless the authorization is terminated or revoked.     Resp Syncytial Virus by PCR NEGATIVE NEGATIVE Final    Comment: (NOTE) Fact Sheet for Patients: bloggercourse.com  Fact Sheet for Healthcare Providers: seriousbroker.it  This test is not yet approved or cleared by the [place]  FDA and has been authorized for detection and/or diagnosis of SARS-CoV-2 by FDA under an Emergency Use Authorization (EUA). This EUA will remain in effect (meaning this test can be used) for the duration of the COVID-19 declaration under Section 564(b)(1) of the Act, 21 U.S.C. section 360bbb-3(b)(1), unless the authorization is terminated or revoked.  Performed at [facility], [address] [place], [place] [address]   Blood Culture (routine x 2)     Status: None (Preliminary result)    Collection Time: 10/01/23 12:44 PM   Specimen: BLOOD LEFT HAND  Result Value Ref Range Status   Specimen Description   Final    BLOOD LEFT HAND Performed at [facility], [address] [place], [place] [address]    Special Requests   Final    Blood Culture adequate volume BOTTLES DRAWN AEROBIC AND ANAEROBIC Performed at [facility], [address] [place], [place] [address]    Culture   Final    NO GROWTH <  24 HOURS Performed at [facility], [address] [place], [place] [address]    Report Status PENDING  Incomplete  Culture, blood (Routine X 2) w Reflex to ID Panel     Status: None (Preliminary result)   Collection Time: 10/01/23  9:02 PM   Specimen: BLOOD LEFT ARM  Result Value Ref Range Status   Specimen Description BLOOD LEFT ARM  Final   Special Requests   Final    BOTTLES DRAWN AEROBIC AND ANAEROBIC Blood Culture results may not be optimal due to an inadequate volume of blood received in culture bottles   Culture   Final    NO GROWTH < 12 HOURS Performed at [facility], [address] [place], [place] [address]    Report Status PENDING  Incomplete     Studies: DG Chest Port 1 View Result Date: 10/01/2023 CLINICAL DATA:  Fever. Questionable sepsis. Evaluate for abnormality. EXAM: PORTABLE CHEST 1 VIEW COMPARISON:  No prior chest radiographs are available for correlation. Limited correlation made with right shoulder radiographs 03/20/2014. FINDINGS: 1325 hours. Low lung volumes with mild atelectasis at both lung bases. There is no confluent airspace disease, pleural effusion or pneumothorax. Linear opacity peripherally in the right hemithorax may reflect scarring or fissural thickening. The heart size and mediastinal contours are normal for the degree of inspiration. The bones appear unremarkable. Telemetry leads overlie the chest. IMPRESSION: Low lung volumes with mild bibasilar atelectasis. No definite evidence of pneumonia.  Electronically Signed   By: Elsie Perone M.D.   On: 10/01/2023 13:56      Vernal Alstrom, MD  [facility] 10/02/2023  If 7PM-7AM, please contact night-coverage

## 2023-10-02 NOTE — Progress Notes (Signed)
 Mobility Specialist Progress Note:   10/02/23 1002  Mobility  Activity Ambulated with assistance in hallway  Level of Assistance Standby assist, set-up cues, supervision of patient - no hands on  Assistive Device None  Distance Ambulated (ft) 350 ft  Activity Response Tolerated well  Mobility Referral Yes  Mobility visit 1 Mobility  Mobility Specialist Start Time (ACUTE ONLY) 0930  Mobility Specialist Stop Time (ACUTE ONLY) 0945  Mobility Specialist Time Calculation (min) (ACUTE ONLY) 15 min   Pre Mobility: 110 HR , 90% SpO2 RA During Mobility: 110-120 HR , 88%-90% SpO2 RA Post Mobility: 113 HR , 90% SpO2 RA  Pt received in bed, agreeable to mobility. No physical assistance needed during ambulation. Ambulated on RA. VSS. See vitals above. No complaints of SOB during session, asx throughout. Pt left EOB as requested with call bell in reach and all needs met.  Son present in room.   Nathan Mays  Mobility Specialist Please contact via [employer] office at [phone]

## 2023-10-03 ENCOUNTER — Other Ambulatory Visit (HOSPITAL_BASED_OUTPATIENT_CLINIC_OR_DEPARTMENT_OTHER): Payer: Self-pay

## 2023-10-03 DIAGNOSIS — J9601 Acute respiratory failure with hypoxia: Secondary | ICD-10-CM | POA: Diagnosis not present

## 2023-10-03 LAB — CBC
HCT: 36 % — ABNORMAL LOW (ref 39.0–52.0)
Hemoglobin: 11.8 g/dL — ABNORMAL LOW (ref 13.0–17.0)
MCH: 29.1 pg (ref 26.0–34.0)
MCHC: 32.8 g/dL (ref 30.0–36.0)
MCV: 88.9 fL (ref 80.0–100.0)
Platelets: 165 10*3/uL (ref 150–400)
RBC: 4.05 MIL/uL — ABNORMAL LOW (ref 4.22–5.81)
RDW: 13.4 % (ref 11.5–15.5)
WBC: 4.5 10*3/uL (ref 4.0–10.5)
nRBC: 0 % (ref 0.0–0.2)

## 2023-10-03 LAB — BASIC METABOLIC PANEL
Anion gap: 13 (ref 5–15)
BUN: 13 mg/dL (ref 8–23)
CO2: 23 mmol/L (ref 22–32)
Calcium: 8.3 mg/dL — ABNORMAL LOW (ref 8.9–10.3)
Chloride: 101 mmol/L (ref 98–111)
Creatinine, Ser: 1.33 mg/dL — ABNORMAL HIGH (ref 0.61–1.24)
GFR, Estimated: >60 mL/min (ref >60)
Glucose, Bld: 121 mg/dL — ABNORMAL HIGH (ref 70–99)
Potassium: 3.9 mmol/L (ref 3.5–5.1)
Sodium: 137 mmol/L (ref 135–145)

## 2023-10-03 LAB — GLUCOSE, CAPILLARY
Glucose-Capillary: 186 mg/dL — ABNORMAL HIGH (ref 70–99)
Glucose-Capillary: 140 mg/dL — ABNORMAL HIGH (ref 70–99)
Glucose-Capillary: 160 mg/dL — ABNORMAL HIGH (ref 70–99)
Glucose-Capillary: 169 mg/dL — ABNORMAL HIGH (ref 70–99)

## 2023-10-03 LAB — MAGNESIUM: Magnesium: 1.9 mg/dL (ref 1.7–2.4)

## 2023-10-03 MED ORDER — BLOOD GLUCOSE MONITOR SYSTEM W/DEVICE KIT
1.0000 | PACK | Freq: Three times a day (TID) | 0 refills | Status: AC
  Filled 2023-10-03: qty 1, 30d supply, fill #0

## 2023-10-03 MED ORDER — GUAIFENESIN-DM 100-10 MG/5ML PO SYRP
15.0000 mL | ORAL_SOLUTION | ORAL | 0 refills | Status: AC | PRN
  Filled 2023-10-03: qty 118, 2d supply, fill #0

## 2023-10-03 MED ORDER — SODIUM CHLORIDE 0.9 % IV SOLN: INTRAVENOUS | Status: DC

## 2023-10-03 MED ORDER — OSELTAMIVIR PHOSPHATE 30 MG PO CAPS
30.0000 mg | ORAL_CAPSULE | Freq: Two times a day (BID) | ORAL | 0 refills | Status: AC
  Filled 2023-10-03: qty 6, 3d supply, fill #0

## 2023-10-03 MED ORDER — LANCETS MISC
1.0000 | Freq: Three times a day (TID) | 0 refills | Status: AC
  Filled 2023-10-03: qty 100, 30d supply, fill #0

## 2023-10-03 MED ORDER — BLOOD GLUCOSE TEST VI STRP
1.0000 | ORAL_STRIP | Freq: Three times a day (TID) | 0 refills | Status: AC
  Filled 2023-10-03: qty 100, 30d supply, fill #0

## 2023-10-03 MED ORDER — METFORMIN HCL 500 MG PO TABS
500.0000 mg | ORAL_TABLET | Freq: Two times a day (BID) | ORAL | 2 refills | Status: AC
  Filled 2023-10-03: qty 60, 30d supply, fill #0

## 2023-10-03 MED ORDER — SODIUM CHLORIDE 0.9 % IV BOLUS
1000.0000 mL | Freq: Once | INTRAVENOUS | Status: AC
  Administered 2023-10-03: 1000 mL via INTRAVENOUS

## 2023-10-03 MED ORDER — LANCET DEVICE MISC
1.0000 | Freq: Three times a day (TID) | 0 refills | Status: AC
  Filled 2023-10-03: qty 1, 30d supply, fill #0

## 2023-10-03 NOTE — Plan of Care (Signed)
  Problem: Education: Goal: Knowledge of General Education information will improve Description: Including pain rating scale, medication(s)/side effects and non-pharmacologic comfort measures Outcome: Progressing   Problem: Clinical Measurements: Goal: Ability to maintain clinical measurements within normal limits will improve Outcome: Progressing   Problem: Activity: Goal: Risk for activity intolerance will decrease Outcome: Progressing   Problem: Nutrition: Goal: Adequate nutrition will be maintained Outcome: Progressing   Problem: Coping: Goal: Level of anxiety will decrease Outcome: Progressing   Problem: Safety: Goal: Ability to remain free from injury will improve Outcome: Progressing

## 2023-10-03 NOTE — Progress Notes (Signed)
   10/03/23 1130  Spiritual Encounters  Type of Visit Initial  Care provided to: [facility] partners present during encounter Nurse  Referral source Code page  Reason for visit Code  OnCall Visit Yes   Chaplain responded to a code blue. Met with patient's son. Chaplain extended hospitality.Chaplain introduced spiritual care services. Spiritual care services available as needed.   Juliene CHRISTELLA Das, Chaplain 10/03/23

## 2023-10-03 NOTE — Progress Notes (Signed)
 1052: Nurse called about pt feeling dizzy while sitting on the side of the bed. Nurse was just about to go over discharge instruction with patient and son during this episode. Vitals and blood sugar taking.   Pt sitting on EOB, getting orthostatic vitals, pt c/o dizziness and wanting to lay down. Pt became restless and trying to lay down while vitals are taking. Pt is diaphoretic, BS-140. Pt became unresponsive, never losing a pulse, mucus coming from his nose. This nurse pulled code button, call MD, MD place new orders. Pt is supine, oxygen place, NS started, EKG done, and pt place on the monitor. Pt is receiving BP q 15 minutes at this time until bolus is finished.

## 2023-10-03 NOTE — Progress Notes (Signed)
 PROGRESS NOTE  Nathan Mays FMW:[id number] DOB: 1961/08/10 DOA: 10/01/2023 PCP: Boyd Jayson HERO, PA-C   LOS: 2 days   Brief narrative:  Patient is a 63 year old African-American male with no documented past medical history presented to hospital with fever poor appetite and lethargy.  He also had syncopal episode.  Works as a scientist, research (medical).  He has been having some myalgia with cough recently.  In the triage he was noted to have pulse ox of 88% on room air, was febrile with a temperature of 103.2 F.  Initial labs showed WBC at 10.6.  Creatinine elevated at 1.6 with sodium of 132.  Respiratory panel was positive for influenza A.  HIV nonreactive.  Chest x-ray showed low lung volumes with mild bibasilar atelectasis.  No definite evidence of pneumonia.  Hemoglobin was 11.5. SABRA  Blood cultures were sent from the ED and the patient was admitted to hospital for influenza with AKI and hyperglycemia.  Assessment/Plan: Principal Problem:   Acute respiratory failure with hypoxia (HCC) Active Problems:   Influenza A   Hyperglycemia   AKI (acute kidney injury) (HCC)   Dehydration with hyponatremia   Obesity, Class II, BMI 35-39.9   Assessment and Plan: * Acute respiratory failure with hypoxia Due to influenza A.  Patient had hypoxia on room air on presentation with dyspnea.  Continue as needed nebs.  Continue Tamiflu .  Blood cultures negative in less than 12 hours.  Was initially on 2 L of nasal cannula but has been weaned to room air at this time.  Temperature max of 100 F.  Overall improving.     Volume depletion with mild hyponatremia. Patient had significant diuresis with hypotension and orthostatic blood pressure today when while trying to stand up.  Received 1 L of IV normal saline.  Will continue with normal saline for next several hours.  Check orthostatics in AM.  Improved sodium level to 136 today.     AKI (acute kidney injury) (HCC) Received IV fluids and poor oral intake.  Last Scr was from  Jan 2022. Scr was 1.23.  COVID IV fluids.  Creatinine today at 1.3 from initial 1.6.  Encourage oral intake.  Creatinine likely at baseline.  Hyperglycemia likely new diagnosis of diabetes type 2. Hemoglobin A1c of 8.0.  Lifestyle modification advised.    Seen by diabetic coordinator.  Will prescribe metformin  on discharge.  Had a prolonged discussion with the patient regarding need for adjustment of medications and lifestyle as outpatient.  Continue sliding scale insulin  while in the hospital.   Influenza A Continue tamiflu  and supportive care.    Obesity, Class II, BMI 35-39.9 BMI 33.95 Would benefit from weight loss as outpatient.  Lifestyle modification advised  DVT prophylaxis: heparin  injection 5,000 Units Start: 10/01/23 2300 SCDs Start: 10/01/23 2215   Disposition: Home likely on 10/04/2023  Status is: Inpatient Remains inpatient appropriate because: Orthostatic hypotension, IV fluids, closer monitoring.    Code Status:     Code Status: Full Code.  Family Communication: Spoke with the patient's son at bedside.  Consultants: None  Procedures: None  Anti-infectives:  Tamiflu   Anti-infectives (From admission, onward)    Start     Dose/Rate Route Frequency Ordered Stop   10/03/23 0000  oseltamivir  (TAMIFLU ) 30 MG capsule        30 mg Oral 2 times daily 10/03/23 1020 10/06/23 2359   10/02/23 1000  cefTRIAXone  (ROCEPHIN ) 2 g in sodium chloride  0.9 % 100 mL IVPB  Status:  Discontinued  2 g 200 mL/hr over 30 Minutes Intravenous Every 24 hours 10/01/23 1651 10/01/23 2136   10/02/23 1000  azithromycin  (ZITHROMAX ) 500 mg in sodium chloride  0.9 % 250 mL IVPB  Status:  Discontinued        500 mg 250 mL/hr over 60 Minutes Intravenous Every 24 hours 10/01/23 1651 10/01/23 2136   10/01/23 2200  oseltamivir  (TAMIFLU ) capsule 30 mg        30 mg Oral 2 times daily 10/01/23 1651     10/01/23 1415  oseltamivir  (TAMIFLU ) capsule 75 mg        75 mg Oral  Once 10/01/23 1403  10/01/23 1410   10/01/23 1245  cefTRIAXone  (ROCEPHIN ) 2 g in sodium chloride  0.9 % 100 mL IVPB        2 g 200 mL/hr over 30 Minutes Intravenous Once 10/01/23 1244 10/01/23 1403   10/01/23 1245  azithromycin  (ZITHROMAX ) 500 mg in sodium chloride  0.9 % 250 mL IVPB        500 mg 250 mL/hr over 60 Minutes Intravenous  Once 10/01/23 1244 10/01/23 1403   10/01/23 1245  metroNIDAZOLE  (FLAGYL ) IVPB 500 mg        500 mg 100 mL/hr over 60 Minutes Intravenous  Once 10/01/23 1244 10/01/23 1403       Subjective: Today, patient was seen and examined at bedside.  Complains of feeling better but was very orthostatic and diaphoretic with brief decreased responsiveness.  Improved with normal saline 1 L bolus.  Has less cough congestion and shortness of breath.    Objective: Vitals:   10/03/23 1147 10/03/23 1202  BP: 115/77 106/75  Pulse: 84 72  Resp:    Temp:    SpO2: 98% 94%   No intake or output data in the 24 hours ending 10/03/23 1536  Filed Weights   10/01/23 1248  Weight: 89.7 kg   Body mass index is 33.95 kg/m.   Physical Exam: GENERAL: Patient is alert awake and oriented. Not in obvious distress.  Obese built, nasal canula in Place HENT: No scleral pallor or icterus. Pupils equally reactive to light. Oral mucosa is moist NECK: is supple, no gross swelling noted. CHEST: Coarse breath sounds bilaterally diminished breath sounds bilaterally. CVS: S1 and S2 heard, no murmur. Regular rate and rhythm.  ABDOMEN: Soft, non-tender, bowel sounds are present. EXTREMITIES: No edema. CNS: Cranial nerves are intact. No focal motor deficits. SKIN: warm and dry without rashes.  Data Review: I have personally reviewed the following laboratory data and studies,  CBC: Recent Labs  Lab 10/01/23 1244 10/01/23 2343 10/03/23 0438  WBC 10.6* 7.1 4.5  NEUTROABS 8.7*  --   --   HGB 13.4 11.5* 11.8*  HCT 39.7 34.5* 36.0*  MCV 88.0 89.8 88.9  PLT 200 161 165   Basic Metabolic Panel: Recent  Labs  Lab 10/01/23 1244 10/01/23 2341 10/03/23 0438  NA 132* 136 137  K 4.0 3.9 3.9  CL 98 104 101  CO2 21* 24 23  GLUCOSE 183* 120* 121*  BUN 18 17 13   CREATININE 1.60* 1.56* 1.33*  CALCIUM 9.0 7.7* 8.3*  MG  --  1.8 1.9   Liver Function Tests: Recent Labs  Lab 10/01/23 1244 10/01/23 2341  AST 35 40  ALT 23 21  ALKPHOS 76 61  BILITOT 0.6 0.5  PROT 7.7 5.8*  ALBUMIN 3.8 2.8*   No results for input(s): LIPASE, AMYLASE in the last 168 hours. No results for input(s): AMMONIA in the last 168  hours. Cardiac Enzymes: No results for input(s): CKTOTAL, CKMB, CKMBINDEX, TROPONINI in the last 168 hours. BNP (last 3 results) No results for input(s): BNP in the last 8760 hours.  ProBNP (last 3 results) No results for input(s): PROBNP in the last 8760 hours.  CBG: Recent Labs  Lab 10/02/23 1137 10/02/23 1657 10/02/23 2039 10/03/23 0808 10/03/23 1055  GLUCAP 141* 129* 141* 186* 140*   Recent Results (from the past 240 hours)  Resp panel by RT-PCR (RSV, Flu A&B, Covid) Anterior Nasal Swab     Status: Abnormal   Collection Time: 10/01/23 12:44 PM   Specimen: Anterior Nasal Swab  Result Value Ref Range Status   SARS Coronavirus 2 by RT PCR NEGATIVE NEGATIVE Final    Comment: (NOTE) SARS-CoV-2 target nucleic acids are NOT DETECTED.  The SARS-CoV-2 RNA is generally detectable in upper respiratory specimens during the acute phase of infection. The lowest concentration of SARS-CoV-2 viral copies this assay can detect is 138 copies/mL. A negative result does not preclude SARS-Cov-2 infection and should not be used as the sole basis for treatment or other patient management decisions. A negative result may occur with  improper specimen collection/handling, submission of specimen other than nasopharyngeal swab, presence of viral mutation(s) within the areas targeted by this assay, and inadequate number of viral copies(<138 copies/mL). A negative result must  be combined with clinical observations, patient history, and epidemiological information. The expected result is Negative.  Fact Sheet for Patients:  bloggercourse.com  Fact Sheet for Healthcare Providers:  seriousbroker.it  This test is no t yet approved or cleared by the [place]  FDA and  has been authorized for detection and/or diagnosis of SARS-CoV-2 by FDA under an Emergency Use Authorization (EUA). This EUA will remain  in effect (meaning this test can be used) for the duration of the COVID-19 declaration under Section 564(b)(1) of the Act, 21 U.S.C.section 360bbb-3(b)(1), unless the authorization is terminated  or revoked sooner.       Influenza A by PCR POSITIVE (A) NEGATIVE Final   Influenza B by PCR NEGATIVE NEGATIVE Final    Comment: (NOTE) The Xpert Xpress SARS-CoV-2/FLU/RSV plus assay is intended as an aid in the diagnosis of influenza from Nasopharyngeal swab specimens and should not be used as a sole basis for treatment. Nasal washings and aspirates are unacceptable for Xpert Xpress SARS-CoV-2/FLU/RSV testing.  Fact Sheet for Patients: bloggercourse.com  Fact Sheet for Healthcare Providers: seriousbroker.it  This test is not yet approved or cleared by the [place]  FDA and has been authorized for detection and/or diagnosis of SARS-CoV-2 by FDA under an Emergency Use Authorization (EUA). This EUA will remain in effect (meaning this test can be used) for the duration of the COVID-19 declaration under Section 564(b)(1) of the Act, 21 U.S.C. section 360bbb-3(b)(1), unless the authorization is terminated or revoked.     Resp Syncytial Virus by PCR NEGATIVE NEGATIVE Final    Comment: (NOTE) Fact Sheet for Patients: bloggercourse.com  Fact Sheet for Healthcare Providers: seriousbroker.it  This test is  not yet approved or cleared by the [place]  FDA and has been authorized for detection and/or diagnosis of SARS-CoV-2 by FDA under an Emergency Use Authorization (EUA). This EUA will remain in effect (meaning this test can be used) for the duration of the COVID-19 declaration under Section 564(b)(1) of the Act, 21 U.S.C. section 360bbb-3(b)(1), unless the authorization is terminated or revoked.  Performed at [facility], [address], [place], [place] [address]  Blood Culture (routine x 2)     Status: None (Preliminary result)   Collection Time: 10/01/23 12:44 PM   Specimen: BLOOD LEFT HAND  Result Value Ref Range Status   Specimen Description   Final    BLOOD LEFT HAND Performed at [facility], [address] [place], [place] [address]    Special Requests   Final    Blood Culture adequate volume BOTTLES DRAWN AEROBIC AND ANAEROBIC Performed at [facility], [address] [place], [place] [address]    Culture   Final    NO GROWTH 2 DAYS Performed at [facility], [address], [place], [place] [address]    Report Status PENDING  Incomplete  Culture, blood (Routine X 2) w Reflex to ID Panel     Status: None (Preliminary result)   Collection Time: 10/01/23  9:02 PM   Specimen: BLOOD LEFT ARM  Result Value Ref Range Status   Specimen Description BLOOD LEFT ARM  Final   Special Requests   Final    BOTTLES DRAWN AEROBIC AND ANAEROBIC Blood Culture results may not be optimal due to an inadequate volume of blood received in culture bottles   Culture   Final    NO GROWTH < 12 HOURS Performed at [facility], [address], [place], [place] [address]    Report Status PENDING  Incomplete  Respiratory (~20 pathogens) panel by PCR     Status: Abnormal   Collection Time: 10/02/23  2:14 PM   Specimen: Nasopharyngeal Swab; Respiratory  Result Value Ref Range Status   Adenovirus NOT DETECTED NOT DETECTED Final   Coronavirus 229E  NOT DETECTED NOT DETECTED Final    Comment: (NOTE) The Coronavirus on the Respiratory Panel, DOES NOT test for the novel  Coronavirus (2019 nCoV)    Coronavirus HKU1 NOT DETECTED NOT DETECTED Final   Coronavirus NL63 NOT DETECTED NOT DETECTED Final   Coronavirus OC43 NOT DETECTED NOT DETECTED Final   Metapneumovirus NOT DETECTED NOT DETECTED Final   Rhinovirus / Enterovirus NOT DETECTED NOT DETECTED Final   Influenza A H1 2009 DETECTED (A) NOT DETECTED Final   Influenza B NOT DETECTED NOT DETECTED Final   Parainfluenza Virus 1 NOT DETECTED NOT DETECTED Final   Parainfluenza Virus 2 NOT DETECTED NOT DETECTED Final   Parainfluenza Virus 3 NOT DETECTED NOT DETECTED Final   Parainfluenza Virus 4 NOT DETECTED NOT DETECTED Final   Respiratory Syncytial Virus NOT DETECTED NOT DETECTED Final   Bordetella pertussis NOT DETECTED NOT DETECTED Final   Bordetella Parapertussis NOT DETECTED NOT DETECTED Final   Chlamydophila pneumoniae NOT DETECTED NOT DETECTED Final   Mycoplasma pneumoniae NOT DETECTED NOT DETECTED Final    Comment: Performed at [facility], [address] [place], [place] [address]     Studies: No results found.     Ibrahim Mcpheeters, MD  [facility] 10/03/2023  If 7PM-7AM, please contact night-coverage

## 2023-10-03 NOTE — Progress Notes (Addendum)
   10/03/23 1102  Assess: MEWS Score  BP (!) 75/59  MAP (mmHg) 66  Pulse Rate 66  Assess: MEWS Score  MEWS Temp 0  MEWS Systolic 2  MEWS Pulse 0  MEWS RR 0  MEWS LOC 0  MEWS Score 2  MEWS Score Color Yellow  Assess: if the MEWS score is Yellow or Red  Were vital signs accurate and taken at a resting state? Yes  Does the patient meet 2 or more of the SIRS criteria? No  MEWS guidelines implemented  Yes, yellow  Treat  MEWS Interventions Considered administering scheduled or prn medications/treatments as ordered  Take Vital Signs  Increase Vital Sign Frequency  Yellow: Q2hr x1, continue Q4hrs until patient remains green for 12hrs  Escalate  MEWS: Escalate Yellow: Discuss with charge nurse and consider notifying provider and/or RRT  Notify: Charge Nurse/RN  Name of Charge Nurse/RN Notified Warehouse Manager  Provider Notification  Provider Name/Title Dr. Sonjia  Date Provider Notified 10/03/23  Time Provider Notified 1100  Method of Notification Page  Notification Reason Change in status  Provider response See new orders  Date of Provider Response 10/03/23  Time of Provider Response 1107  Notify: Rapid Response  Name of Rapid Response RN Notified Nikki RN  Date Rapid Response Notified 10/03/23  Time Rapid Response Notified 1107  Assess: SIRS CRITERIA  SIRS Temperature  0  SIRS Respirations  0  SIRS Pulse 0  SIRS WBC 0  SIRS Score Sum  0

## 2023-10-03 NOTE — Plan of Care (Signed)
  Problem: Clinical Measurements: Goal: Will remain free from infection Outcome: Progressing Goal: Cardiovascular complication will be avoided Outcome: Progressing   Problem: Activity: Goal: Risk for activity intolerance will decrease Outcome: Progressing   Problem: Pain Managment: Goal: General experience of comfort will improve and/or be controlled Outcome: Progressing   Problem: Skin Integrity: Goal: Risk for impaired skin integrity will decrease Outcome: Progressing

## 2023-10-04 ENCOUNTER — Other Ambulatory Visit: Payer: Self-pay

## 2023-10-04 DIAGNOSIS — J9601 Acute respiratory failure with hypoxia: Secondary | ICD-10-CM | POA: Diagnosis not present

## 2023-10-04 LAB — GLUCOSE, CAPILLARY: Glucose-Capillary: 165 mg/dL — ABNORMAL HIGH (ref 70–99)

## 2023-10-04 NOTE — Discharge Summary (Signed)
 Physician Discharge Summary  Nathan Mays FMW:[id number] DOB: 28-Apr-1961 DOA: 10/01/2023  PCP: Boyd Jayson HERO, PA-C  Admit date: 10/01/2023 Discharge date: 10/04/2023  Admitted From: Home  Discharge disposition: Home   Recommendations for Outpatient Follow-Up:   Follow up with your primary care provider in one week.  Check CBC, BMP, magnesium in the next visit Patient has been diagnosed with diabetes and will need adequate monitoring and medication adjustment as outpatient.  Discharge Diagnosis:   Principal Problem:   Acute respiratory failure with hypoxia (HCC) Active Problems:   Influenza A   Hyperglycemia   AKI (acute kidney injury) (HCC)   Dehydration with hyponatremia   Obesity, Class II, BMI 35-39.9   Discharge Condition: Improved.  Diet recommendation: Low sodium, heart healthy.  Diabetic diet  Wound care: None.  Code status: Full.   History of Present Illness:   Patient is a 63 year old African-American male with no documented past medical history presented to hospital with fever poor appetite and lethargy. He also had syncopal episode. Works as a scientist, research (medical). He has been having some myalgia with cough recently. In the triage he was noted to have pulse ox of 88% on room air, was febrile with a temperature of 103.2 F. Initial labs showed WBC at 10.6. Creatinine elevated at 1.6 with sodium of 132. Respiratory panel was positive for influenza A. HIV nonreactive. Chest x-ray showed low lung volumes with mild bibasilar atelectasis. No definite evidence of pneumonia. Hemoglobin was 11.5. SABRA Blood cultures were sent from the ED and the patient was admitted to hospital for influenza with AKI and hyperglycemia.   Hospital Course:   Following conditions were addressed during hospitalization as listed below,   Acute respiratory failure with hypoxia Resolved due to influenza A.  Patient had hypoxia on room air on presentation with dyspnea.  During hospitalization patient  received Tamiflu  nebulizers.  Blood cultures negative. Was initially on 2 L of nasal cannula but has been weaned to room air at this time.  Afebrile prior to discharge.  Volume depletion with mild hyponatremia. Syncope on presentation. Patient had orthostatic hypotension received IV fluids with significant improvement.  Was not orthostatic prior to discharge.  Encouraged oral hydration on discharge with orthostatic precautions.  AKI (acute kidney injury) (HCC) Received IV fluids and poor oral intake.  Last Scr was from Jan 2022. Scr was 1.23.  COVID IV fluids.  Latest creatinine at 1.3 from initial 1.6.  Encourage oral intake.  Creatinine likely at baseline.   Hyperglycemia likely new diagnosis of diabetes type 2. Hemoglobin A1c of 8.0.  Lifestyle modification advised.    Seen by diabetic coordinator hospitalization.  Will prescribe metformin  on discharge.  Had a prolonged discussion with the patient regarding need for adjustment of medications and lifestyle as outpatient.    Influenza A Continue tamiflu  and supportive care.  Has felt much better.   Obesity, class I. BMI 33.95. Would benefit from weight loss as outpatient.  Lifestyle modification advised  Disposition.  At this time, patient is stable for disposition home with outpatient PCP follow-up.  Medical Consultants:   None.  Procedures:    None Subjective:   Today, patient was seen and examined at bedside.  Patient feels better.  No dizziness, lightheadedness shortness of breath cough or congestion.  Discharge Exam:   Vitals:   10/04/23 0412 10/04/23 0751  BP: (!) 105/58 116/73  Pulse: 84 87  Resp: 18 15  Temp: 98.8 F (37.1 C) 98.8 F (37.1 C)  SpO2: 94%  94%   Vitals:   10/03/23 2036 10/04/23 0025 10/04/23 0412 10/04/23 0751  BP: 95/63 106/77 (!) 105/58 116/73  Pulse: 81 87 84 87  Resp: 18 18 18 15   Temp: 98.3 F (36.8 C) 98.6 F (37 C) 98.8 F (37.1 C) 98.8 F (37.1 C)  TempSrc:  Oral Oral Oral  SpO2:  97% 92% 94% 94%  Weight:      Height:       Body mass index is 33.95 kg/m.   General: Alert awake, not in obvious distress, obese HENT: pupils equally reacting to light,  No scleral pallor or icterus noted. Oral mucosa is moist.  Chest:  Clear breath sounds.  No crackles or wheezes.  CVS: S1 &S2 heard. No murmur.  Regular rate and rhythm. Abdomen: Soft, nontender, nondistended.  Bowel sounds are heard.   Extremities: No cyanosis, clubbing or edema.  Peripheral pulses are palpable. Psych: Alert, awake and oriented, normal mood CNS:  No cranial nerve deficits.  Power equal in all extremities.   Skin: Warm and dry.  No rashes noted.  The results of significant diagnostics from this hospitalization (including imaging, microbiology, ancillary and laboratory) are listed below for reference.     Diagnostic Studies:   DG Chest Port 1 View Result Date: 10/01/2023 CLINICAL DATA:  Fever. Questionable sepsis. Evaluate for abnormality. EXAM: PORTABLE CHEST 1 VIEW COMPARISON:  No prior chest radiographs are available for correlation. Limited correlation made with right shoulder radiographs 03/20/2014. FINDINGS: 1325 hours. Low lung volumes with mild atelectasis at both lung bases. There is no confluent airspace disease, pleural effusion or pneumothorax. Linear opacity peripherally in the right hemithorax may reflect scarring or fissural thickening. The heart size and mediastinal contours are normal for the degree of inspiration. The bones appear unremarkable. Telemetry leads overlie the chest. IMPRESSION: Low lung volumes with mild bibasilar atelectasis. No definite evidence of pneumonia. Electronically Signed   By: Elsie Perone M.D.   On: 10/01/2023 13:56     Labs:   Basic Metabolic Panel: Recent Labs  Lab 10/01/23 1244 10/01/23 2341 10/03/23 0438  NA 132* 136 137  K 4.0 3.9 3.9  CL 98 104 101  CO2 21* 24 23  GLUCOSE 183* 120* 121*  BUN 18 17 13   CREATININE 1.60* 1.56* 1.33*  CALCIUM  9.0 7.7* 8.3*  MG  --  1.8 1.9   GFR Estimated Creatinine Clearance: 58.2 mL/min (A) (by C-G formula based on SCr of 1.33 mg/dL (H)). Liver Function Tests: Recent Labs  Lab 10/01/23 1244 10/01/23 2341  AST 35 40  ALT 23 21  ALKPHOS 76 61  BILITOT 0.6 0.5  PROT 7.7 5.8*  ALBUMIN 3.8 2.8*   No results for input(s): LIPASE, AMYLASE in the last 168 hours. No results for input(s): AMMONIA in the last 168 hours. Coagulation profile Recent Labs  Lab 10/01/23 1244  INR 1.1    CBC: Recent Labs  Lab 10/01/23 1244 10/01/23 2343 10/03/23 0438  WBC 10.6* 7.1 4.5  NEUTROABS 8.7*  --   --   HGB 13.4 11.5* 11.8*  HCT 39.7 34.5* 36.0*  MCV 88.0 89.8 88.9  PLT 200 161 165   Cardiac Enzymes: No results for input(s): CKTOTAL, CKMB, CKMBINDEX, TROPONINI in the last 168 hours. BNP: Invalid input(s): POCBNP CBG: Recent Labs  Lab 10/03/23 0808 10/03/23 1055 10/03/23 1623 10/03/23 1956 10/04/23 0749  GLUCAP 186* 140* 160* 169* 165*   D-Dimer No results for input(s): DDIMER in the last 72 hours. Hgb A1c  Recent Labs    10/01/23 2343  HGBA1C 8.0*   Lipid Profile No results for input(s): CHOL, HDL, LDLCALC, TRIG, CHOLHDL, LDLDIRECT in the last 72 hours. Thyroid function studies No results for input(s): TSH, T4TOTAL, T3FREE, THYROIDAB in the last 72 hours.  Invalid input(s): FREET3 Anemia work up No results for input(s): VITAMINB12, FOLATE, FERRITIN, TIBC, IRON, RETICCTPCT in the last 72 hours. Microbiology Recent Results (from the past 240 hours)  Resp panel by RT-PCR (RSV, Flu A&B, Covid) Anterior Nasal Swab     Status: Abnormal   Collection Time: 10/01/23 12:44 PM   Specimen: Anterior Nasal Swab  Result Value Ref Range Status   SARS Coronavirus 2 by RT PCR NEGATIVE NEGATIVE Final    Comment: (NOTE) SARS-CoV-2 target nucleic acids are NOT DETECTED.  The SARS-CoV-2 RNA is generally detectable in upper  respiratory specimens during the acute phase of infection. The lowest concentration of SARS-CoV-2 viral copies this assay can detect is 138 copies/mL. A negative result does not preclude SARS-Cov-2 infection and should not be used as the sole basis for treatment or other patient management decisions. A negative result may occur with  improper specimen collection/handling, submission of specimen other than nasopharyngeal swab, presence of viral mutation(s) within the areas targeted by this assay, and inadequate number of viral copies(<138 copies/mL). A negative result must be combined with clinical observations, patient history, and epidemiological information. The expected result is Negative.  Fact Sheet for Patients:  bloggercourse.com  Fact Sheet for Healthcare Providers:  seriousbroker.it  This test is no t yet approved or cleared by the [place]  FDA and  has been authorized for detection and/or diagnosis of SARS-CoV-2 by FDA under an Emergency Use Authorization (EUA). This EUA will remain  in effect (meaning this test can be used) for the duration of the COVID-19 declaration under Section 564(b)(1) of the Act, 21 U.S.C.section 360bbb-3(b)(1), unless the authorization is terminated  or revoked sooner.       Influenza A by PCR POSITIVE (A) NEGATIVE Final   Influenza B by PCR NEGATIVE NEGATIVE Final    Comment: (NOTE) The Xpert Xpress SARS-CoV-2/FLU/RSV plus assay is intended as an aid in the diagnosis of influenza from Nasopharyngeal swab specimens and should not be used as a sole basis for treatment. Nasal washings and aspirates are unacceptable for Xpert Xpress SARS-CoV-2/FLU/RSV testing.  Fact Sheet for Patients: bloggercourse.com  Fact Sheet for Healthcare Providers: seriousbroker.it  This test is not yet approved or cleared by the [place]  FDA and has been  authorized for detection and/or diagnosis of SARS-CoV-2 by FDA under an Emergency Use Authorization (EUA). This EUA will remain in effect (meaning this test can be used) for the duration of the COVID-19 declaration under Section 564(b)(1) of the Act, 21 U.S.C. section 360bbb-3(b)(1), unless the authorization is terminated or revoked.     Resp Syncytial Virus by PCR NEGATIVE NEGATIVE Final    Comment: (NOTE) Fact Sheet for Patients: bloggercourse.com  Fact Sheet for Healthcare Providers: seriousbroker.it  This test is not yet approved or cleared by the [place]  FDA and has been authorized for detection and/or diagnosis of SARS-CoV-2 by FDA under an Emergency Use Authorization (EUA). This EUA will remain in effect (meaning this test can be used) for the duration of the COVID-19 declaration under Section 564(b)(1) of the Act, 21 U.S.C. section 360bbb-3(b)(1), unless the authorization is terminated or revoked.  Performed at [facility], [address] [place], [place] [address]   Blood Culture (  routine x 2)     Status: None (Preliminary result)   Collection Time: 10/01/23 12:44 PM   Specimen: BLOOD LEFT HAND  Result Value Ref Range Status   Specimen Description   Final    BLOOD LEFT HAND Performed at [facility], [address] [place], [place] [address]    Special Requests   Final    Blood Culture adequate volume BOTTLES DRAWN AEROBIC AND ANAEROBIC Performed at [facility], [address] [place], [place] [address]    Culture   Final    NO GROWTH 2 DAYS Performed at [facility], [address] [place], [place] [address]    Report Status PENDING  Incomplete  Culture, blood (Routine X 2) w Reflex to ID Panel     Status: None (Preliminary result)   Collection Time: 10/01/23  9:02 PM   Specimen: BLOOD LEFT ARM  Result Value Ref Range Status   Specimen Description BLOOD LEFT  ARM  Final   Special Requests   Final    BOTTLES DRAWN AEROBIC AND ANAEROBIC Blood Culture results may not be optimal due to an inadequate volume of blood received in culture bottles   Culture   Final    NO GROWTH < 12 HOURS Performed at [facility], [address], [place], [place] [address]    Report Status PENDING  Incomplete  Respiratory (~20 pathogens) panel by PCR     Status: Abnormal   Collection Time: 10/02/23  2:14 PM   Specimen: Nasopharyngeal Swab; Respiratory  Result Value Ref Range Status   Adenovirus NOT DETECTED NOT DETECTED Final   Coronavirus 229E NOT DETECTED NOT DETECTED Final    Comment: (NOTE) The Coronavirus on the Respiratory Panel, DOES NOT test for the novel  Coronavirus (2019 nCoV)    Coronavirus HKU1 NOT DETECTED NOT DETECTED Final   Coronavirus NL63 NOT DETECTED NOT DETECTED Final   Coronavirus OC43 NOT DETECTED NOT DETECTED Final   Metapneumovirus NOT DETECTED NOT DETECTED Final   Rhinovirus / Enterovirus NOT DETECTED NOT DETECTED Final   Influenza A H1 2009 DETECTED (A) NOT DETECTED Final   Influenza B NOT DETECTED NOT DETECTED Final   Parainfluenza Virus 1 NOT DETECTED NOT DETECTED Final   Parainfluenza Virus 2 NOT DETECTED NOT DETECTED Final   Parainfluenza Virus 3 NOT DETECTED NOT DETECTED Final   Parainfluenza Virus 4 NOT DETECTED NOT DETECTED Final   Respiratory Syncytial Virus NOT DETECTED NOT DETECTED Final   Bordetella pertussis NOT DETECTED NOT DETECTED Final   Bordetella Parapertussis NOT DETECTED NOT DETECTED Final   Chlamydophila pneumoniae NOT DETECTED NOT DETECTED Final   Mycoplasma pneumoniae NOT DETECTED NOT DETECTED Final    Comment: Performed at [facility], [address] [place], [place] [address]     Discharge Instructions:   Discharge Instructions     Amb Referral to Nutrition and Diabetic Education   Complete by: As directed    Patient may want more information/help after discharge. Newly diagnosed T2DM.    Call MD for:  redness, tenderness, or signs of infection (pain, swelling, redness, odor or green/yellow discharge around incision site)   Complete by: As directed    Call MD for:  temperature >100.4   Complete by: As directed    Diet Carb Modified   Complete by: As directed    Discharge instructions   Complete by: As directed    Follow-up with your primary care provider in  1 week.  Check blood work at that time.  You have been started on medication for diabetes that needs to be followed up with primary care provider.  Seek medical attention for worsening symptoms.   Increase activity slowly   Complete by: As directed       Allergies as of 10/04/2023   No Known Allergies      Medication List     TAKE these medications    Blood Glucose Monitor System w/Device Kit Use as directed to check blood sugar 3 (three) times daily.   BLOOD GLUCOSE TEST STRIPS Strp Use as directed to check blood sugar 3 (three) times daily.   guaiFENesin -dextromethorphan  100-10 MG/5ML syrup Commonly known as: ROBITUSSIN DM Take 15 mLs by mouth every 4 (four) hours as needed for cough.   Lancet Device Misc Use as directed to check blood sugar 3 (three) times daily.   Lancets Misc Use as directed to check blood sugar 3 (three) times daily.   metFORMIN  500 MG tablet Commonly known as: GLUCOPHAGE  Take 1 tablet (500 mg total) by mouth 2 (two) times daily with a meal.   oseltamivir  30 MG capsule Commonly known as: TAMIFLU  Take 1 capsule (30 mg total) by mouth 2 (two) times daily for 3 days.        Follow-up Information     Boyd Jayson HERO, PA-C Follow up in 1 week(s).   Specialty: Internal Medicine Contact information: [address] [facility] [place] [address] [phone]                  Time coordinating discharge: 39 minutes  Signed:  Halei Hanover  [facility] 10/04/2023, 9:21 AM

## 2023-10-04 NOTE — Progress Notes (Signed)
 Forrester Primus to be D/C'd  per MD order.  Discussed with the patient and all questions fully answered.  VSS, Skin clean, dry and intact without evidence of skin break down, no evidence of skin tears noted.  IV catheter discontinued intact. Site without signs and symptoms of complications. Dressing and pressure applied.  An After Visit Summary was printed and given to the patient. Patient received work note from the physician.  D/c education completed with patient/family including follow up instructions, medication list, d/c activities limitations if indicated, with other d/c instructions as indicated by MD - patient able to verbalize understanding, all questions fully answered.   Patient instructed to return to ED, call 911, or call MD for any changes in condition.   Patient to be escorted via WC, and D/C home via private auto.

## 2023-10-05 ENCOUNTER — Other Ambulatory Visit (HOSPITAL_BASED_OUTPATIENT_CLINIC_OR_DEPARTMENT_OTHER): Payer: Self-pay

## 2023-10-06 LAB — CULTURE, BLOOD (ROUTINE X 2)
Culture: NO GROWTH
Culture: NO GROWTH
Special Requests: ADEQUATE

## 2023-10-07 ENCOUNTER — Other Ambulatory Visit (HOSPITAL_BASED_OUTPATIENT_CLINIC_OR_DEPARTMENT_OTHER): Payer: Self-pay
